# Patient Record
Sex: Female | Born: 2000 | Race: White | Hispanic: No | Marital: Single | Smoking: Former smoker
Health system: Southern US, Community
[De-identification: ages and names within clinical notes are randomized; demographics above are authoritative.]

## PROBLEM LIST (undated history)

## (undated) ENCOUNTER — Inpatient Hospital Stay: Payer: Self-pay

## (undated) DIAGNOSIS — N83209 Unspecified ovarian cyst, unspecified side: Secondary | ICD-10-CM

## (undated) DIAGNOSIS — B2790 Infectious mononucleosis, unspecified without complication: Secondary | ICD-10-CM

## (undated) DIAGNOSIS — T7840XA Allergy, unspecified, initial encounter: Secondary | ICD-10-CM

## (undated) DIAGNOSIS — J302 Other seasonal allergic rhinitis: Secondary | ICD-10-CM

## (undated) DIAGNOSIS — R51 Headache: Secondary | ICD-10-CM

## (undated) DIAGNOSIS — S82892A Other fracture of left lower leg, initial encounter for closed fracture: Secondary | ICD-10-CM

## (undated) HISTORY — PX: WISDOM TOOTH EXTRACTION: SHX21

## (undated) HISTORY — PX: APPENDECTOMY: SHX54

## (undated) HISTORY — DX: Unspecified ovarian cyst, unspecified side: N83.209

## (undated) HISTORY — PX: TONSILLECTOMY: SUR1361

---
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
## 2018-09-17 ENCOUNTER — Ambulatory Visit: Payer: BLUE CROSS/BLUE SHIELD | Admitting: Obstetrics & Gynecology

## 2018-09-20 ENCOUNTER — Encounter: Payer: Self-pay | Admitting: Obstetrics and Gynecology

## 2018-09-20 ENCOUNTER — Ambulatory Visit: Payer: BLUE CROSS/BLUE SHIELD | Admitting: Obstetrics and Gynecology

## 2018-09-20 ENCOUNTER — Other Ambulatory Visit: Payer: Self-pay

## 2018-09-20 VITALS — BP 100/64 | Wt 130.0 lb

## 2018-09-20 DIAGNOSIS — Z3046 Encounter for surveillance of implantable subdermal contraceptive: Secondary | ICD-10-CM

## 2018-09-20 DIAGNOSIS — Z3049 Encounter for surveillance of other contraceptives: Secondary | ICD-10-CM | POA: Diagnosis not present

## 2018-09-22 ENCOUNTER — Encounter: Payer: Self-pay | Admitting: Obstetrics and Gynecology

## 2018-09-22 NOTE — Progress Notes (Signed)
  GYNECOLOGY PROCEDURE NOTE  Nexplanon removal discussed in detail.  Risks of infection, bleeding, nerve injury all reviewed.  Patient understands risks and desires to proceed.  Verbal consent obtained.  Patient is certain she wants the Nexplanon removed.  All questions answered.  Procedure: Patient placed in dorsal supine with left arm above head, elbow flexed at 90 degrees, arm resting on examination table.  Nexplanon identified without problems.  Betadine scrub x3.  1 ml of 1% lidocaine injected under Nexplanondevice without problems.  Sterile gloves applied.  Small 0.5cm incision made at distal tip of Nexplanon device with 11 blade scalpel.  Nexplanon brought to incision and grasped with a small kelly clamp.  Nexplanon removed intact without problems.  Pressure applied to incision.  Hemostasis obtained.  Steri-strips applied, followed by bandage and compression dressing.  Patient tolerated procedure well.  No complications.   Assessment: 18 y.o. year old female now s/p uncomplicated Nexplanon removal.  Plan: 1.  Patient given post procedure precautions and asked to call for fever, chills, redness or drainage from her incision, bleeding from incision.  She understands she will likely have a small bruise near site of removal and can remove bandage tomorrow and steri-strips in approximately 1 week.  2) Contraception: discussed multiple forms.  She will consider an IUD or an alternative form of contraception. She states she had an IUD in the past and multiple forms of combined oral contraceptives were utilized to try to control her irregular bleeding, which were ineffective.  She also states that multiple tests were run to determine why she continued to bleed.  None of these test resulted in any etiology.  She will see if removal of the Nexplanon device will regulate her periods and return with a new plan.  She was given strict precautions about using some form of at least barrier method of birth  control in the meantime.  Prentice Docker, MD, [facility], [facility] 09/20/2018 3:36 PM

## 2018-09-27 ENCOUNTER — Ambulatory Visit: Payer: BLUE CROSS/BLUE SHIELD | Admitting: Obstetrics and Gynecology

## 2019-02-18 DIAGNOSIS — M5416 Radiculopathy, lumbar region: Secondary | ICD-10-CM | POA: Insufficient documentation

## 2019-02-26 ENCOUNTER — Ambulatory Visit: Payer: BLUE CROSS/BLUE SHIELD | Admitting: Obstetrics and Gynecology

## 2019-03-21 DIAGNOSIS — M545 Low back pain, unspecified: Secondary | ICD-10-CM | POA: Insufficient documentation

## 2019-06-09 ENCOUNTER — Other Ambulatory Visit: Payer: Self-pay

## 2019-06-09 ENCOUNTER — Ambulatory Visit
Admission: EM | Admit: 2019-06-09 | Discharge: 2019-06-09 | Disposition: A | Discharge status: Home / Self Care | Payer: BC Managed Care – PPO | Attending: Emergency Medicine | Admitting: Emergency Medicine

## 2019-06-09 DIAGNOSIS — J069 Acute upper respiratory infection, unspecified: Secondary | ICD-10-CM

## 2019-06-09 DIAGNOSIS — Z1152 Encounter for screening for COVID-19: Secondary | ICD-10-CM

## 2019-06-09 DIAGNOSIS — R059 Cough, unspecified: Secondary | ICD-10-CM

## 2019-06-09 DIAGNOSIS — R05 Cough: Secondary | ICD-10-CM

## 2019-06-09 MED ORDER — PREDNISONE 10 MG PO TABS: 20.0000 mg | ORAL_TABLET | Freq: Every day | ORAL | 0 refills | Status: DC

## 2019-06-09 MED ORDER — BENZONATATE 100 MG PO CAPS: 100.0000 mg | ORAL_CAPSULE | Freq: Three times a day (TID) | ORAL | 0 refills | Status: DC

## 2019-06-09 MED ORDER — LORATADINE 10 MG PO TABS: 10.0000 mg | ORAL_TABLET | Freq: Every day | ORAL | 0 refills | Status: DC

## 2019-06-09 MED ORDER — FLUTICASONE PROPIONATE 50 MCG/ACT NA SUSP: 1.0000 | Freq: Every day | NASAL | 0 refills | Status: DC

## 2019-06-09 NOTE — ED Provider Notes (Signed)
[facility]    CSN: [id number] Arrival date & time: 06/09/19  1626      History   Chief Complaint Chief Complaint  Patient presents with  . Cough    HPI Destiny Mckenzie is a 19 y.o. female.   who presents  the urgent care with a complaint of cough, sore throat, runny nose, bilateral ear pain with left worse than the right for the past 3 days.  Denies sick exposure to COVID, flu or strep.  Denies recent travel.  Denies aggravating or alleviating symptoms.  Denies previous COVID infection.   Denies fever, chills, fatigue, rhinorrhea, SOB, wheezing, chest pain, nausea, vomiting, changes in bowel or bladder habits.    The history is provided by the patient. No language interpreter was used.  Cough Associated symptoms: ear pain and sore throat     Past Medical History:  Diagnosis Date  . Allergy   . Fracture of ankle, left, closed 2012 and 2013  . Headache(784.0)   . Mononucleosis jan 2014  . Seasonal allergies     Patient Active Problem List   Diagnosis Date Noted  . Acute appendicitis 10/07/2013    Past Surgical History:  Procedure Laterality Date  . APPENDECTOMY    . LAPAROSCOPIC APPENDECTOMY N/A 10/07/2013   Procedure: APPENDECTOMY LAPAROSCOPIC;  Surgeon: Dalia Heading, MD;  Location: [facility];  Service: General;  Laterality: N/A;  . TONSILLECTOMY    . TONSILLECTOMY  01/30/12    OB History    Gravida  0   Para  0   Term  0   Preterm  0   AB  0   Living  0     SAB  0   TAB  0   Ectopic  0   Multiple  0   Live Births  0            Home Medications    Prior to Admission medications   Medication Sig Start Date End Date Taking? Authorizing Provider  benzonatate (TESSALON) 100 MG capsule Take 1 capsule (100 mg total) by mouth every 8 (eight) hours. 06/09/19   Suhayla Chisom, Zachery Dakins, FNP  fluticasone (FLONASE) 50 MCG/ACT nasal spray Place 1 spray into both nostrils daily for 14 days. 06/09/19 06/23/19  Savannaha Stonerock, Zachery Dakins, FNP  ibuprofen  (ADVIL,MOTRIN) 600 MG tablet Take 1 tablet (600 mg total) by mouth every 6 (six) hours as needed. 03/05/17   Eber Hong, MD  predniSONE (DELTASONE) 10 MG tablet Take 2 tablets (20 mg total) by mouth daily. 06/09/19   Tineka Uriegas, Zachery Dakins, FNP    Family History Family History  Problem Relation Age of Onset  . Obesity Mother     Social History Social History   Tobacco Use  . Smoking status: Former Smoker    Packs/day: 0.30  . Smokeless tobacco: Never Used  Substance Use Topics  . Alcohol use: No  . Drug use: No     Allergies   Contrast media [iodinated diagnostic agents], Fleet enema [enema], Hydroxyzine, Miralax [polyethylene glycol], Bactrim [sulfamethoxazole-trimethoprim], Contrast media [iodinated diagnostic agents], Hydroxyzine, and Sulfa antibiotics   Review of Systems Review of Systems  Constitutional: Negative.   HENT: Positive for congestion, ear pain and sore throat.   Respiratory: Positive for cough.   Cardiovascular: Negative.   Gastrointestinal: Negative.   Neurological: Negative.   All other systems reviewed and are negative.    Physical Exam Triage Vital Signs ED Triage Vitals [06/09/19 1719]  Enc Vitals Group  BP 109/73     Pulse Rate 83     Resp 16     Temp 98.1 F (36.7 C)     Temp src      SpO2 98 %     Weight      Height      Head Circumference      Peak Flow      Pain Score 2     Pain Loc      Pain Edu?      Excl. in GC?    No data found.  Updated Vital Signs BP 109/73   Pulse 83   Temp 98.1 F (36.7 C)   Resp 16   LMP 06/01/2019   SpO2 98%   Visual Acuity Right Eye Distance:   Left Eye Distance:   Bilateral Distance:    Right Eye Near:   Left Eye Near:    Bilateral Near:     Physical Exam Vitals and nursing note reviewed.  Constitutional:      General: She is not in acute distress.    Appearance: Normal appearance. She is normal weight. She is not ill-appearing or toxic-appearing.  HENT:     Head:  Normocephalic.     Right Ear: Tympanic membrane, ear canal and external ear normal. There is no impacted cerumen.     Left Ear: Tympanic membrane, ear canal and external ear normal. There is no impacted cerumen.     Nose: Nose normal. No congestion.     Mouth/Throat:     Mouth: Mucous membranes are moist.     Pharynx: Oropharynx is clear. No oropharyngeal exudate or posterior oropharyngeal erythema.  Cardiovascular:     Rate and Rhythm: Normal rate and regular rhythm.     Pulses: Normal pulses.     Heart sounds: Normal heart sounds. No murmur.  Pulmonary:     Effort: Pulmonary effort is normal. No respiratory distress.     Breath sounds: Normal breath sounds. No wheezing or rhonchi.  Chest:     Chest wall: No tenderness.  Neurological:     Mental Status: She is alert and oriented to person, place, and time.      UC Treatments / Results  Labs (all labs ordered are listed, but only abnormal results are displayed) Labs Reviewed  NOVEL CORONAVIRUS, NAA    EKG   Radiology No results found.  Procedures Procedures (including critical care time)  Medications Ordered in UC Medications - No data to display  Initial Impression / Assessment and Plan / UC Course  I have reviewed the triage vital signs and the nursing notes.  Pertinent labs & imaging results that were available during my care of the patient were reviewed by me and considered in my medical decision making (see chart for details).    Patient is stable at discharge.  Tessalon Perles,  Flonase and prednisone were prescribed.  Was advised to quarantine until COVID-19 test result become available.  Work note was given.  Final Clinical Impressions(s) / UC Diagnoses   Final diagnoses:  Cough  Encounter for screening for COVID-19  URI with cough and congestion     Discharge Instructions     COVID testing ordered.  It will take between 2-7 days for test results.  Someone will contact you regarding abnormal  results.    In the meantime: You should remain isolated in your home for 10 days from symptom onset AND greater than 24 hours after symptoms resolution (absence of fever  without the use of fever-reducing medication and improvement in respiratory symptoms), whichever is longer Get plenty of rest and push fluids Tessalon Perles prescribed for cough Flonase prescribed for nasal congestion and runny nose Prednisone was prescribed Use medications daily for symptom relief Use OTC medications like ibuprofen or tylenol as needed fever or pain Call or go to the ED if you have any new or worsening symptoms such as fever, worsening cough, shortness of breath, chest tightness, chest pain, turning blue, changes in mental status, etc...     ED Prescriptions    Medication Sig Dispense Auth. Provider   benzonatate (TESSALON) 100 MG capsule Take 1 capsule (100 mg total) by mouth every 8 (eight) hours. 30 capsule Lowery Paullin S, FNP   fluticasone (FLONASE) 50 MCG/ACT nasal spray Place 1 spray into both nostrils daily for 14 days. 16 g Jandy Brackens S, FNP   predniSONE (DELTASONE) 10 MG tablet Take 2 tablets (20 mg total) by mouth daily. 15 tablet Dalin Caldera, Darrelyn Hillock, FNP     PDMP not reviewed this encounter.   Emerson Monte, [place] 06/09/19 1752

## 2019-06-09 NOTE — ED Triage Notes (Addendum)
Cough, sore throat, runny nose and bilateral ear pain (worse on left), denies fever since Friday.  patient had COVID in December and states does not feel the same, patient declined testing

## 2019-06-09 NOTE — Discharge Instructions (Addendum)
COVID testing ordered.  It will take between 2-7 days for test results.  Someone will contact you regarding abnormal results.    In the meantime: You should remain isolated in your home for 10 days from symptom onset AND greater than 24  hours after symptoms resolution (absence of fever without the use of fever-reducing medication and improvement in respiratory symptoms), whichever is longer Get plenty of rest and push fluids Tessalon Perles prescribed for cough Flonase prescribed for nasal congestion and runny nose Prednisone was prescribed Use medications daily for symptom relief Use OTC medications like ibuprofen or tylenol as needed fever or pain Call or go to the ED if you have any new or worsening symptoms such as fever, worsening cough, shortness of breath, chest tightness, chest pain, turning blue, changes in mental status, etc.... 

## 2019-06-10 LAB — NOVEL CORONAVIRUS, NAA: SARS-CoV-2, NAA: NOT DETECTED

## 2019-06-10 LAB — SARS-COV-2, NAA 2 DAY TAT

## 2019-09-03 ENCOUNTER — Encounter: Payer: Self-pay | Admitting: Advanced Practice Midwife

## 2019-09-03 ENCOUNTER — Other Ambulatory Visit: Payer: Self-pay

## 2019-09-03 ENCOUNTER — Ambulatory Visit (INDEPENDENT_AMBULATORY_CARE_PROVIDER_SITE_OTHER): Payer: BC Managed Care – PPO | Admitting: Advanced Practice Midwife

## 2019-09-03 VITALS — BP 110/70 | Ht 63.0 in | Wt 130.0 lb

## 2019-09-03 DIAGNOSIS — R102 Pelvic and perineal pain: Secondary | ICD-10-CM

## 2019-09-03 DIAGNOSIS — Z01419 Encounter for gynecological examination (general) (routine) without abnormal findings: Secondary | ICD-10-CM

## 2019-09-03 DIAGNOSIS — N921 Excessive and frequent menstruation with irregular cycle: Secondary | ICD-10-CM | POA: Diagnosis not present

## 2019-09-03 DIAGNOSIS — Z30011 Encounter for initial prescription of contraceptive pills: Secondary | ICD-10-CM | POA: Diagnosis not present

## 2019-09-03 MED ORDER — NORETHIN ACE-ETH ESTRAD-FE 1-20 MG-MCG PO TABS: 1.0000 | ORAL_TABLET | Freq: Every day | ORAL | 4 refills | Status: DC

## 2019-09-04 ENCOUNTER — Encounter: Payer: Self-pay | Admitting: Advanced Practice Midwife

## 2019-09-04 NOTE — Progress Notes (Signed)
Gynecology Annual Exam  PCP: Moishe Spice Evern Bio, MD  Chief Complaint:  Chief Complaint  Patient presents with  . Gynecologic Exam    aunnal exam    History of Present Illness: Patient is a 19 y.o. G0P0000 presents for annual exam. The patient has complaints today of frequent, heavy and painful periods. She had her Nexplanon removed about a year ago and in recent months has had heavy flow periods every 2 weeks lasting for 7 days. She has abdominal and back pain with her periods. She requests hormonal birth control for contraception/cycle control.  LMP: Patient's last menstrual period was 08/27/2019. Menarche:9 Average Interval: regular, 14 days Duration of flow: 7 days Heavy Menses: yes Clots: yes Intermenstrual Bleeding: no Postcoital Bleeding: no Dysmenorrhea: yes  The patient is not currently sexually active. She currently uses none for contraception. She denies dyspareunia.  The patient does perform self breast exams.  There is no notable family history of breast or ovarian cancer in her family.  The patient wears seatbelts: yes.  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 She admits healthy diet and drinks some water. She drinks 2 sodas per day. She gets about 5 hours sleep per night.    The patient denies current symptoms of depression.    Review of Systems: Review of Systems  Constitutional: Negative for chills and fever.  HENT: Negative for congestion, ear discharge, ear pain, hearing loss, sinus pain and sore throat.   Eyes: Negative for blurred vision and double vision.  Respiratory: Negative for cough, shortness of breath and wheezing.   Cardiovascular: Negative for chest pain, palpitations and leg swelling.  Gastrointestinal: Negative for abdominal pain, blood in stool, constipation, diarrhea, heartburn, melena, nausea and vomiting.  Genitourinary: Negative for dysuria, flank pain, frequency, hematuria and urgency.       Positive for heavy,  painful, frequent periods  Musculoskeletal: Negative for back pain, joint pain and myalgias.  Skin: Negative for itching and rash.  Neurological: Negative for dizziness, tingling, tremors, sensory change, speech change, focal weakness, seizures, loss of consciousness, weakness and headaches.  Endo/Heme/Allergies: Negative for environmental allergies. Does not bruise/bleed easily.  Psychiatric/Behavioral: Negative for depression, hallucinations, memory loss, substance abuse and suicidal ideas. The patient is not nervous/anxious and does not have insomnia.     Past Medical History:  Patient Active Problem List   Diagnosis Date Noted  . Low back pain 03/21/2019  . Lumbar radiculopathy 02/18/2019    Past Surgical History:  Past Surgical History:  Procedure Laterality Date  . APPENDECTOMY    . LAPAROSCOPIC APPENDECTOMY N/A 10/07/2013   Procedure: APPENDECTOMY LAPAROSCOPIC;  Surgeon: Dalia Heading, MD;  Location: [facility];  Service: General;  Laterality: N/A;  . TONSILLECTOMY    . TONSILLECTOMY  01/30/12    Gynecologic History:  Patient's last menstrual period was 08/27/2019. Contraception: none Last Pap: has not had a PAP smear- will defer til next year  Obstetric History: G0P0000  Family History:  Family History  Problem Relation Age of Onset  . Obesity Mother     Social History:  Social History   Socioeconomic History  . Marital status: Single    Spouse name: Not on file  . Number of children: Not on file  . Years of education: Not on file  . Highest education level: Not on file  Occupational History  . Not on file  Tobacco Use  . Smoking status: Former Smoker    Packs/day: 0.30  .  Smokeless tobacco: Never Used  Substance and Sexual Activity  . Alcohol use: No  . Drug use: No  . Sexual activity: Yes    Partners: Male    Birth control/protection: Implant  Other Topics Concern  . Not on file  Social History Narrative   ** Merged History Encounter **        Social Determinants of Health   Financial Resource Strain:   . Difficulty of Paying Living Expenses:   Food Insecurity:   . Worried About Programme researcher, broadcasting/film/video in the Last Year:   . Barista in the Last Year:   Transportation Needs:   . Freight forwarder (Medical):   [name] Lack of Transportation (Non-Medical):   Physical Activity:   . Days of Exercise per Week:   . Minutes of Exercise per Session:   Stress:   . Feeling of Stress :   Social Connections:   . Frequency of Communication with Friends and Family:   . Frequency of Social Gatherings with Friends and Family:   . Attends Religious Services:   . Active Member of Clubs or Organizations:   . Attends Banker Meetings:   [name] Marital Status:   Intimate Partner Violence:   . Fear of Current or Ex-Partner:   . Emotionally Abused:   [name] Physically Abused:   . Sexually Abused:     Allergies:  Allergies  Allergen Reactions  . Tramadol Other (See Comments)  . Fleet Enema [Enema]   . Miralax [Polyethylene Glycol]   . Contrast Media [Iodinated Diagnostic Agents] Rash  . Hydroxyzine Rash  . Sodium Phosphates Rash  . Sulfa Antibiotics Rash    Medications: Prior to Admission medications   Medication Sig Start Date End Date Taking? Authorizing Provider  acetaminophen (TYLENOL) 500 MG tablet Take by mouth.    [provider]  albuterol (VENTOLIN HFA) 108 (90 Base) MCG/ACT inhaler Inhale into the lungs.    [provider]  Cetirizine HCl (ZYRTEC ALLERGY) 10 MG CAPS Take by mouth.    [provider]  fexofenadine (ALLEGRA) 60 MG tablet Take by mouth.    [provider]  fluticasone (FLONASE) 50 MCG/ACT nasal spray Place into the nose.    [provider]  Loratadine (CLARITIN) 10 MG CAPS Take by mouth.    [provider]  norethindrone-ethinyl estradiol (JUNEL FE 1/20) 1-20 MG-MCG tablet Take 1 tablet by mouth daily. 09/03/19   Tresea Mall, CNM  olopatadine  (PATANOL) 0.1 % ophthalmic solution Apply to eye.    [provider]  polyethylene glycol powder (GLYCOLAX/MIRALAX) 17 GM/SCOOP powder Take by mouth.    [provider]    Physical Exam Vitals: Blood pressure 110/70, height 5\' 3"  (1.6 m), weight 130 lb (59 kg), last menstrual period 08/27/2019.  General: NAD HEENT: normocephalic, anicteric Thyroid: no enlargement, no palpable nodules Pulmonary: No increased work of breathing, CTAB Cardiovascular: RRR, distal pulses 2+ Breast: Breast symmetrical, no tenderness, no palpable nodules or masses, no skin or nipple retraction present, no nipple discharge.  No axillary or supraclavicular lymphadenopathy. Abdomen: NABS, soft, non-tender, non-distended.  Umbilicus without lesions.  No hepatomegaly, splenomegaly or masses palpable. No evidence of hernia  Genitourinary:  External: Normal external female genitalia.  Normal urethral meatus, normal Bartholin's and Skene's glands.    Vagina: Normal vaginal mucosa, no evidence of prolapse.    Cervix: no CMT  Uterus: Non-enlarged, mobile, normal contour.    Adnexa: ovaries non-enlarged, no adnexal masses, moderate  tenderness to palpation on left side  Rectal: deferred  Lymphatic: no evidence of inguinal lymphadenopathy Extremities: no edema, erythema, or tenderness Neurologic: Grossly intact Psychiatric: mood appropriate, affect full    Assessment: 19 y.o. G0P0000 routine annual exam, menometrorrhagia, left ovary tenderness  Plan: Problem List Items Addressed This Visit    None    Visit Diagnoses    Pelvic pain in female    -  Primary   Relevant Orders   US PELVIS TRANSVAGINAL NON-OB (TV ONLY)   Menometrorrhagia       Relevant Medications   norethindrone-ethinyl estradiol (JUNEL FE 1/20) 1-20 MG-MCG tablet   Encounter for initial prescription of contraceptive pills       Relevant Medications   norethindrone-ethinyl estradiol (JUNEL FE 1/20) 1-20 MG-MCG tablet   Well woman  exam with routine gynecological exam       Relevant Medications   norethindrone-ethinyl estradiol (JUNEL FE 1/20) 1-20 MG-MCG tablet      1) 4) Gardasil Series discussed and if applicable offered to patient - Patient has not previously completed 3 shot series   2) STI screening  was offered and declined  3)  ASCCP guidelines and rationale discussed.  Patient opts for beginning at age 89 screening interval  4) Contraception - the patient is currently using  none.  She is interested in starting Contraception: OCP Rx sent today. We discussed safe sex practices to reduce her furture risk of STI's.    5) Return in about 1 week (around 09/10/2019) for gyn u/s and follow up visit.   Tresea Mall, CNM [facility] 09/04/2019, 12:07 PM

## 2019-09-14 ENCOUNTER — Ambulatory Visit
Admission: EM | Admit: 2019-09-14 | Discharge: 2019-09-14 | Disposition: A | Discharge status: Home / Self Care | Payer: BC Managed Care – PPO | Attending: Family Medicine | Admitting: Family Medicine

## 2019-09-14 ENCOUNTER — Ambulatory Visit (INDEPENDENT_AMBULATORY_CARE_PROVIDER_SITE_OTHER): Payer: BC Managed Care – PPO

## 2019-09-14 DIAGNOSIS — S99921A Unspecified injury of right foot, initial encounter: Secondary | ICD-10-CM | POA: Diagnosis not present

## 2019-09-14 DIAGNOSIS — M79671 Pain in right foot: Secondary | ICD-10-CM

## 2019-09-14 DIAGNOSIS — S93401A Sprain of unspecified ligament of right ankle, initial encounter: Secondary | ICD-10-CM | POA: Diagnosis not present

## 2019-09-14 DIAGNOSIS — M25571 Pain in right ankle and joints of right foot: Secondary | ICD-10-CM

## 2019-09-14 NOTE — Discharge Instructions (Addendum)
Take ibuprofen as needed.  Rest and elevate your foot.  Apply ice packs 2-3 times a day for up to 20 minutes each. Wear the brace as needed and for comfort.  Follow up with your primary care provider or an orthopedist if you symptoms continue or worsen;  Or if you develop new symptoms, such as numbness, tingling, or weakness.

## 2019-09-14 NOTE — ED Triage Notes (Signed)
Pt has injury to right foot after someone fell off stool on to her foot

## 2019-09-14 NOTE — ED Provider Notes (Signed)
Destiny Mckenzie   [id number] 09/14/19 Arrival Time: 1541  YQ:[id number] PAIN  SUBJECTIVE: History from: patient. Destiny Mckenzie is a 18 y.o. female complains of right foot and ankle pain that began about an hour ago. Reports that someone fell off of a stool and onto her foot. Localizes the pain to the medial aspect of the right foot and ankle. Describes the pain as constant and achy in character. Has not taken medications. Symptoms are made worse with activity. Denies similar symptoms in the past.  Denies fever, chills, erythema, ecchymosis, effusion, weakness, numbness and tingling, saddle paresthesias, loss of bowel or bladder function.      ROS: As per HPI.  All other pertinent ROS negative.     Past Medical History:  Diagnosis Date  . Allergy   . Fracture of ankle, left, closed 2012 and 2013  . Headache(784.0)   . Mononucleosis jan 2014  . Seasonal allergies    Past Surgical History:  Procedure Laterality Date  . APPENDECTOMY    . LAPAROSCOPIC APPENDECTOMY N/A 10/07/2013   Procedure: APPENDECTOMY LAPAROSCOPIC;  Surgeon: Dalia Heading, MD;  Location: [facility];  Service: General;  Laterality: N/A;  . TONSILLECTOMY    . TONSILLECTOMY  01/30/12   Allergies  Allergen Reactions  . Tramadol Other (See Comments)  . Fleet Enema [Enema]   . Miralax [Polyethylene Glycol]   . Contrast Media [Iodinated Diagnostic Agents] Rash  . Hydroxyzine Rash  . Sodium Phosphates Rash  . Sulfa Antibiotics Rash   No current facility-administered medications on file prior to encounter.   Current Outpatient Medications on File Prior to Encounter  Medication Sig Dispense Refill  . acetaminophen (TYLENOL) 500 MG tablet Take by mouth.    [name] albuterol (VENTOLIN HFA) 108 (90 Base) MCG/ACT inhaler Inhale into the lungs.    . Cetirizine HCl (ZYRTEC ALLERGY) 10 MG CAPS Take by mouth.    . fexofenadine (ALLEGRA) 60 MG tablet Take by mouth.    . fluticasone (FLONASE) 50 MCG/ACT nasal spray Place into the  nose.    . Loratadine (CLARITIN) 10 MG CAPS Take by mouth.    . norethindrone-ethinyl estradiol (JUNEL FE 1/20) 1-20 MG-MCG tablet Take 1 tablet by mouth daily. 84 tablet 4  . olopatadine (PATANOL) 0.1 % ophthalmic solution Apply to eye.    . polyethylene glycol powder (GLYCOLAX/MIRALAX) 17 GM/SCOOP powder Take by mouth.     Social History   Socioeconomic History  . Marital status: Single    Spouse name: Not on file  . Number of children: Not on file  . Years of education: Not on file  . Highest education level: Not on file  Occupational History  . Not on file  Tobacco Use  . Smoking status: Former Smoker    Packs/day: 0.30  . Smokeless tobacco: Never Used  Substance and Sexual Activity  . Alcohol use: No  . Drug use: No  . Sexual activity: Yes    Partners: Male    Birth control/protection: Implant  Other Topics Concern  . Not on file  Social History Narrative   ** Merged History Encounter **       Social Determinants of Health   Financial Resource Strain:   . Difficulty of Paying Living Expenses: Not on file  Food Insecurity:   . Worried About Programme researcher, broadcasting/film/video in the Last Year: Not on file  . Ran Out of Food in the Last Year: Not on file  Transportation Needs:   . Lack  of Transportation (Medical): Not on file  . Lack of Transportation (Non-Medical): Not on file  Physical Activity:   . Days of Exercise per Week: Not on file  . Minutes of Exercise per Session: Not on file  Stress:   . Feeling of Stress : Not on file  Social Connections:   . Frequency of Communication with Friends and Family: Not on file  . Frequency of Social Gatherings with Friends and Family: Not on file  . Attends Religious Services: Not on file  . Active Member of Clubs or Organizations: Not on file  . Attends Banker Meetings: Not on file  . Marital Status: Not on file  Intimate Partner Violence:   . Fear of Current or Ex-Partner: Not on file  . Emotionally Abused: Not on  file  . Physically Abused: Not on file  . Sexually Abused: Not on file   Family History  Problem Relation Age of Onset  . Obesity Mother     OBJECTIVE:  Vitals:   09/14/19 1609  BP: 105/64  Pulse: 95  Resp: 20  Temp: 98.2 F (36.8 C)  SpO2: 99%    General appearance: ALERT; in no acute distress.  Head: NCAT Lungs: Normal respiratory effort CV: pulses 2+ bilaterally. Cap refill < 2 seconds Musculoskeletal:  Inspection: Skin warm, dry, clear and intact without obvious erythema, effusion, or ecchymosis.  Palpation: left ankle mildly tender to palpation ROM: limited ROM active and passive to R foot and ankle Skin: warm and dry Neurologic: Ambulates without difficulty; Sensation intact about the upper/ lower extremities Psychological: alert and cooperative; normal mood and affect  DIAGNOSTIC STUDIES:  No results found.   ASSESSMENT & PLAN:  1. Sprain of right ankle, unspecified ligament, initial encounter   2. Right foot pain   3. Acute right ankle pain    Right foot xray negative Brace applied in office Wear brace when up and active and for comfort Continue conservative management of rest, ice, and gentle stretches Take ibuprofen as needed for pain relief (may cause abdominal discomfort, ulcers, and GI bleeds avoid taking with other NSAIDs)  Follow up with PCP if symptoms persist Return or go to the ER if you have any new or worsening symptoms (fever, chills, chest pain, abdominal pain, changes in bowel or bladder habits, pain radiating into lower legs)    Reviewed expectations re: course of current medical issues. Questions answered. Outlined signs and symptoms indicating need for more acute intervention. Patient verbalized understanding. After Visit Summary given.       Moshe Cipro, NP 09/17/19 1354

## 2019-09-16 ENCOUNTER — Ambulatory Visit (INDEPENDENT_AMBULATORY_CARE_PROVIDER_SITE_OTHER): Payer: BC Managed Care – PPO | Admitting: Advanced Practice Midwife

## 2019-09-16 ENCOUNTER — Encounter: Payer: Self-pay | Admitting: Advanced Practice Midwife

## 2019-09-16 ENCOUNTER — Other Ambulatory Visit: Payer: Self-pay

## 2019-09-16 ENCOUNTER — Ambulatory Visit (INDEPENDENT_AMBULATORY_CARE_PROVIDER_SITE_OTHER): Payer: BC Managed Care – PPO

## 2019-09-16 VITALS — BP 120/80 | Ht 63.0 in | Wt 130.0 lb

## 2019-09-16 DIAGNOSIS — R102 Pelvic and perineal pain: Secondary | ICD-10-CM | POA: Diagnosis not present

## 2019-09-16 DIAGNOSIS — Z09 Encounter for follow-up examination after completed treatment for conditions other than malignant neoplasm: Secondary | ICD-10-CM | POA: Diagnosis not present

## 2019-09-16 NOTE — Progress Notes (Signed)
Patient ID: Destiny Mckenzie, female   DOB: November 03, 2000, 19 y.o.   MRN: [id number]  Reason for Consult: Follow-up    Subjective:  HPI:  Destiny Mckenzie is a 19 y.o. female being seen for follow up visit after pelvic ultrasound. We reviewed the normal results of her ultrasound today. She has not yet noticed a difference in her bleeding pattern since she just started her birth control.   Past Medical History:  Diagnosis Date  . Allergy   . Fracture of ankle, left, closed 2012 and 2013  . Headache(784.0)   . Mononucleosis jan 2014  . Seasonal allergies    Family History  Problem Relation Age of Onset  . Obesity Mother    Past Surgical History:  Procedure Laterality Date  . APPENDECTOMY    . LAPAROSCOPIC APPENDECTOMY N/A 10/07/2013   Procedure: APPENDECTOMY LAPAROSCOPIC;  Surgeon: Dalia Heading, MD;  Location: [facility];  Service: General;  Laterality: N/A;  . TONSILLECTOMY    . TONSILLECTOMY  01/30/12    Short Social History:  Social History   Tobacco Use  . Smoking status: Former Smoker    Packs/day: 0.30  . Smokeless tobacco: Never Used  Substance Use Topics  . Alcohol use: No    Allergies  Allergen Reactions  . Tramadol Other (See Comments)  . Fleet Enema [Enema]   . Miralax [Polyethylene Glycol]   . Contrast Media [Iodinated Diagnostic Agents] Rash  . Hydroxyzine Rash  . Sodium Phosphates Rash  . Sulfa Antibiotics Rash    Current Outpatient Medications  Medication Sig Dispense Refill  . acetaminophen (TYLENOL) 500 MG tablet Take by mouth.    [name] albuterol (VENTOLIN HFA) 108 (90 Base) MCG/ACT inhaler Inhale into the lungs.    . Cetirizine HCl (ZYRTEC ALLERGY) 10 MG CAPS Take by mouth.    . fexofenadine (ALLEGRA) 60 MG tablet Take by mouth.    . fluticasone (FLONASE) 50 MCG/ACT nasal spray Place into the nose.    . Loratadine (CLARITIN) 10 MG CAPS Take by mouth.    . norethindrone-ethinyl estradiol (JUNEL FE 1/20) 1-20 MG-MCG tablet Take 1 tablet by mouth  daily. 84 tablet 4  . olopatadine (PATANOL) 0.1 % ophthalmic solution Apply to eye.    . polyethylene glycol powder (GLYCOLAX/MIRALAX) 17 GM/SCOOP powder Take by mouth.    . SODIUM FLUORIDE 5000 PPM 1.1 % PSTE Take by mouth.     No current facility-administered medications for this visit.   Review of Systems  Constitutional: Negative for chills and fever.  HENT: Negative for congestion, ear discharge, ear pain, hearing loss, sinus pain and sore throat.   Eyes: Negative for blurred vision and double vision.  Respiratory: Negative for cough, shortness of breath and wheezing.   Cardiovascular: Negative for chest pain, palpitations and leg swelling.  Gastrointestinal: Negative for abdominal pain, blood in stool, constipation, diarrhea, heartburn, melena, nausea and vomiting.  Genitourinary: Negative for dysuria, flank pain, frequency, hematuria and urgency.  Musculoskeletal: Negative for back pain, joint pain and myalgias.  Skin: Negative for itching and rash.  Neurological: Negative for dizziness, tingling, tremors, sensory change, speech change, focal weakness, seizures, loss of consciousness, weakness and headaches.  Endo/Heme/Allergies: Negative for environmental allergies. Does not bruise/bleed easily.  Psychiatric/Behavioral: Negative for depression, hallucinations, memory loss, substance abuse and suicidal ideas. The patient is not nervous/anxious and does not have insomnia.         Objective:  Objective   Vitals:   09/16/19 1533  BP: 120/80  Weight: 130 lb (59 kg)  Height: 5\' 3"  (1.6 m)   Body mass index is 23.03 kg/m. Constitutional: Well nourished, well developed female in no acute distress.  HEENT: normal Skin: Warm and dry.  Respiratory: Clear to auscultation bilateral. Normal respiratory effort Neuro: DTRs 2+, Cranial nerves grossly intact Psych: Alert and Oriented x3. No memory deficits. Normal mood and affect.  MS: normal gait, normal bilateral lower extremity  ROM/strength/stability.  Data: Narrative & Impression  Patient Name: Destiny Mckenzie DOB: September 14, 2000 MRN: [id number]   ULTRASOUND REPORT  Location: [facility]  Date of Service: 09/16/2019     Indications:Pelvic Pain   Findings:  The uterus is anteverted and measures 6.7 x 4.6 x 2.9 cm. Echo texture is homogenous without evidence of focal masses. The Endometrium measures 6.4 mm.  Right Ovary measures 5.0 x 2.6 x 3.1 cm. It is normal in appearance. There is a dominant follicle in the right ovary measuring 25.0 x 22.2 x 23.7 mm. Left Ovary measures 3.2 x 1.9 x 2.2 cm. It is normal in appearance. Survey of the adnexa demonstrates no adnexal masses. There is no free fluid in the cul de sac.  Impression: 1. Normal pelvic ultrasound.   Recommendations: 1.Clinical correlation with the patient's History and Physical Exam.  09/18/2019, RT    Purpose of visit was primarily consultation. Limited physical exam.  Assessment/Plan:     19 y.o. G0 P0 female with normal pelvic ultrasound  Follow up PRN and for annual exam in 1 year   36 CNM [facility] 09/16/2019, 3:45 PM

## 2020-01-24 NOTE — L&D Delivery Note (Signed)
Delivery Note  First Stage: Labor onset: 0500 Induction: Cytotec, Pitocin, AROM, Cook cath.  Analgesia /Anesthesia intrapartum: IVPM x 1, Epidural AROM at 1433  Second Stage: Complete dilation at 2012 Onset of pushing at 2012 FHR second stage Cat II, variables with UCs, mod variability  Delivery of a viable female infant on 11/20/20 at  2043 by CNM; delivery of fetal head in ROA position with restitution to ROT, Loose nuchal cord x 1;  Anterior then posterior shoulders delivered easily with gentle downward traction. Baby placed on mom's chest, and attended to by peds.  Cord double clamped after cessation of pulsation, cut by pts mother.  Cord blood sample collected   Third Stage: Placenta delivered spontaneously intact with 3VC @ 2053 Placenta disposition: routine disposal Uterine tone firm / bleeding scant  1st deg midline vaginal laceration identified, perineal skin intact.  Anesthesia for repair: epidural Repair 2-0 Vicryl CT-1 Est. Blood Loss (mL): 150  Complications: none  Mom to postpartum.  Baby to Couplet care / Skin to Skin.  Newborn: Birth Weight: pending  Apgar Scores: 8/9 Feeding planned: formula

## 2020-01-27 ENCOUNTER — Ambulatory Visit: Admit: 2020-01-27 | Discharge status: Against Medical Advice | Payer: BC Managed Care – PPO

## 2020-04-05 ENCOUNTER — Other Ambulatory Visit: Payer: Self-pay

## 2020-04-05 ENCOUNTER — Ambulatory Visit (LOCAL_COMMUNITY_HEALTH_CENTER): Payer: Self-pay

## 2020-04-05 VITALS — BP 103/63 | Ht 63.0 in | Wt 141.5 lb

## 2020-04-05 DIAGNOSIS — Z3201 Encounter for pregnancy test, result positive: Secondary | ICD-10-CM

## 2020-04-05 LAB — PREGNANCY, URINE: Preg Test, Ur: POSITIVE — AB

## 2020-04-05 MED ORDER — PRENATAL 27-0.8 MG PO TABS: 1.0000 | ORAL_TABLET | Freq: Every day | ORAL | 0 refills | Status: AC

## 2020-04-05 NOTE — Progress Notes (Signed)
UPT positive. Plans prenatal care at [facility] and pt states she has appt scheduled 04/14/2020. To DSS for Medicaid/Preg women. Jerel Shepherd, RN

## 2020-04-12 ENCOUNTER — Emergency Department
Admission: EM | Admit: 2020-04-12 | Discharge: 2020-04-12 | Disposition: A | Discharge status: Home / Self Care | Payer: Medicaid Other | Attending: Emergency Medicine | Admitting: Emergency Medicine

## 2020-04-12 ENCOUNTER — Other Ambulatory Visit: Payer: Self-pay

## 2020-04-12 ENCOUNTER — Emergency Department: Payer: Medicaid Other

## 2020-04-12 DIAGNOSIS — R1030 Lower abdominal pain, unspecified: Secondary | ICD-10-CM | POA: Diagnosis not present

## 2020-04-12 DIAGNOSIS — Y92410 Unspecified street and highway as the place of occurrence of the external cause: Secondary | ICD-10-CM | POA: Insufficient documentation

## 2020-04-12 DIAGNOSIS — O26891 Other specified pregnancy related conditions, first trimester: Secondary | ICD-10-CM | POA: Insufficient documentation

## 2020-04-12 DIAGNOSIS — Z87891 Personal history of nicotine dependence: Secondary | ICD-10-CM | POA: Diagnosis not present

## 2020-04-12 DIAGNOSIS — Z3A09 9 weeks gestation of pregnancy: Secondary | ICD-10-CM | POA: Diagnosis not present

## 2020-04-12 LAB — CBC
HCT: 36.8 % (ref 36.0–46.0)
Hemoglobin: 12.6 g/dL (ref 12.0–15.0)
MCH: 31.6 pg (ref 26.0–34.0)
MCHC: 34.2 g/dL (ref 30.0–36.0)
MCV: 92.2 fL (ref 80.0–100.0)
Platelets: 420 10*3/uL — ABNORMAL HIGH (ref 150–400)
RBC: 3.99 MIL/uL (ref 3.87–5.11)
RDW: 11.7 % (ref 11.5–15.5)
WBC: 8 10*3/uL (ref 4.0–10.5)
nRBC: 0 % (ref 0.0–0.2)

## 2020-04-12 LAB — URINALYSIS, COMPLETE (UACMP) WITH MICROSCOPIC
Bilirubin Urine: NEGATIVE
Glucose, UA: NEGATIVE mg/dL
Hgb urine dipstick: NEGATIVE
Ketones, ur: NEGATIVE mg/dL
Leukocytes,Ua: NEGATIVE
Nitrite: NEGATIVE
Protein, ur: NEGATIVE mg/dL
Specific Gravity, Urine: 1.01 (ref 1.005–1.030)
pH: 5 (ref 5.0–8.0)

## 2020-04-12 LAB — COMPREHENSIVE METABOLIC PANEL
ALT: 22 U/L (ref 0–44)
AST: 17 U/L (ref 15–41)
Albumin: 4.3 g/dL (ref 3.5–5.0)
Alkaline Phosphatase: 50 U/L (ref 38–126)
Anion gap: 7 (ref 5–15)
BUN: 11 mg/dL (ref 6–20)
CO2: 22 mmol/L (ref 22–32)
Calcium: 9.5 mg/dL (ref 8.9–10.3)
Chloride: 105 mmol/L (ref 98–111)
Creatinine, Ser: 0.79 mg/dL (ref 0.44–1.00)
GFR, Estimated: >60 mL/min (ref >60)
Glucose, Bld: 93 mg/dL (ref 70–99)
Potassium: 4.1 mmol/L (ref 3.5–5.1)
Sodium: 134 mmol/L — ABNORMAL LOW (ref 135–145)
Total Bilirubin: 0.7 mg/dL (ref 0.3–1.2)
Total Protein: 7.3 g/dL (ref 6.5–8.1)

## 2020-04-12 LAB — POC URINE PREG, ED: Preg Test, Ur: POSITIVE — AB

## 2020-04-12 LAB — HCG, QUANTITATIVE, PREGNANCY: hCG, Beta Chain, Quant, S: 21874 m[IU]/mL — ABNORMAL HIGH (ref <5)
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
## 2020-04-12 NOTE — Discharge Instructions (Signed)
Please follow-up with your OB/GYN on the appointment on Wednesday.  As we discussed, there was a gestational sac and a yolk sac, the support structure for the fetus, but no apparent fetus.  This may be because it is too early to see it, or it may have developed without 1.  If you develop any vaginal bleeding or passage of clots prior to seeing your OB/GYN, please return to the ED.

## 2020-04-21 DIAGNOSIS — Z3490 Encounter for supervision of normal pregnancy, unspecified, unspecified trimester: Secondary | ICD-10-CM | POA: Insufficient documentation

## 2020-04-21 HISTORY — DX: Encounter for supervision of normal pregnancy, unspecified, unspecified trimester: Z34.90

## 2020-05-03 ENCOUNTER — Encounter: Payer: Self-pay | Admitting: Emergency Medicine

## 2020-05-03 ENCOUNTER — Emergency Department
Admission: EM | Admit: 2020-05-03 | Discharge: 2020-05-03 | Disposition: A | Discharge status: Home / Self Care | Payer: Medicaid Other | Attending: Emergency Medicine | Admitting: Emergency Medicine

## 2020-05-03 ENCOUNTER — Other Ambulatory Visit: Payer: Self-pay

## 2020-05-03 DIAGNOSIS — J069 Acute upper respiratory infection, unspecified: Secondary | ICD-10-CM

## 2020-05-03 DIAGNOSIS — O219 Vomiting of pregnancy, unspecified: Secondary | ICD-10-CM | POA: Diagnosis not present

## 2020-05-03 DIAGNOSIS — O99511 Diseases of the respiratory system complicating pregnancy, first trimester: Secondary | ICD-10-CM | POA: Insufficient documentation

## 2020-05-03 DIAGNOSIS — E860 Dehydration: Secondary | ICD-10-CM | POA: Insufficient documentation

## 2020-05-03 DIAGNOSIS — O26891 Other specified pregnancy related conditions, first trimester: Secondary | ICD-10-CM | POA: Diagnosis present

## 2020-05-03 DIAGNOSIS — Z3A08 8 weeks gestation of pregnancy: Secondary | ICD-10-CM | POA: Diagnosis not present

## 2020-05-03 DIAGNOSIS — Z87891 Personal history of nicotine dependence: Secondary | ICD-10-CM | POA: Insufficient documentation

## 2020-05-03 LAB — URINALYSIS, COMPLETE (UACMP) WITH MICROSCOPIC
Bacteria, UA: NONE SEEN
Bilirubin Urine: NEGATIVE
Glucose, UA: NEGATIVE mg/dL
Hgb urine dipstick: NEGATIVE
Ketones, ur: NEGATIVE mg/dL
Leukocytes,Ua: NEGATIVE
Nitrite: NEGATIVE
Protein, ur: NEGATIVE mg/dL
Specific Gravity, Urine: 1.003 — ABNORMAL LOW (ref 1.005–1.030)
pH: 6 (ref 5.0–8.0)

## 2020-05-03 MED ORDER — METOCLOPRAMIDE HCL 10 MG PO TABS: 10.0000 mg | ORAL_TABLET | Freq: Three times a day (TID) | ORAL | 1 refills | Status: DC | PRN

## 2020-05-03 MED ORDER — METOCLOPRAMIDE HCL 10 MG PO TABS
10.0000 mg | ORAL_TABLET | Freq: Once | ORAL | Status: AC
  Administered 2020-05-03: 10 mg via ORAL
  Filled 2020-05-03: qty 1

## 2020-05-03 NOTE — ED Provider Notes (Signed)
[facility] Emergency Department Provider Note   ____________________________________________   Event Date/Time   First MD Initiated Contact with Patient 05/03/20 [phone]     (approximate)  I have reviewed the triage vital signs and the nursing notes.   HISTORY  Chief Complaint URI    HPI SEARA HINESLEY is a 20 y.o. female patient complain of sinus congestion cough for 4 days.  Patient was seen at [facility] 2 days ago had a negative Covid test.  Patient denies fever.  Patient complains of generalized body aches.  Patient states she was told she was dehydrated.  Patient is taking Phenergan for nausea secondary to [redacted] weeks gestation.  Patient states she cannot keep anything down.  Patient states the Phenergan only makes her drowsy.         Past Medical History:  Diagnosis Date  . Allergy   . Fracture of ankle, left, closed 2012 and 2013  . Headache(784.0)   . Mononucleosis jan 2014  . Seasonal allergies     Patient Active Problem List   Diagnosis Date Noted  . Low back pain 03/21/2019  . Lumbar radiculopathy 02/18/2019    Past Surgical History:  Procedure Laterality Date  . APPENDECTOMY    . LAPAROSCOPIC APPENDECTOMY N/A 10/07/2013   Procedure: APPENDECTOMY LAPAROSCOPIC;  Surgeon: Dalia Heading, MD;  Location: [facility];  Service: General;  Laterality: N/A;  . TONSILLECTOMY    . TONSILLECTOMY  01/30/12    Prior to Admission medications   Medication Sig Start Date End Date Taking? Authorizing Provider  metoCLOPramide (REGLAN) 10 MG tablet Take 1 tablet (10 mg total) by mouth every 8 (eight) hours as needed for nausea. 05/03/20 05/03/21 Yes Joni Reining, PA-C  acetaminophen (TYLENOL) 500 MG tablet Take by mouth. Patient not taking: Reported on 04/05/2020    [provider]  albuterol (VENTOLIN HFA) 108 (90 Base) MCG/ACT inhaler Inhale into the lungs. Patient not taking: Reported on 04/05/2020    [provider]   Cetirizine HCl (ZYRTEC ALLERGY) 10 MG CAPS Take by mouth. Patient not taking: Reported on 04/05/2020    [provider]  fexofenadine (ALLEGRA) 60 MG tablet Take by mouth. Patient not taking: Reported on 04/05/2020    [provider]  fluticasone (FLONASE) 50 MCG/ACT nasal spray Place into the nose. Patient not taking: Reported on 04/05/2020    [provider]  Loratadine 10 MG CAPS Take by mouth. Patient not taking: Reported on 04/05/2020    [provider]  Prenatal Vit-Fe Fumarate-FA (MULTIVITAMIN-PRENATAL) 27-0.8 MG TABS tablet Take 1 tablet by mouth daily at 12 noon. 04/05/20 07/14/20  Federico Flake, MD    Allergies Tramadol, Fleet enema [enema], Miralax [polyethylene glycol], Bactrim [sulfamethoxazole-trimethoprim], Contrast media [iodinated diagnostic agents], Hydroxyzine, Sodium phosphates, and Sulfa antibiotics  Family History  Problem Relation Age of Onset  . Obesity Mother     Social History Social History   Tobacco Use  . Smoking status: Former Smoker    Packs/day: 0.30  . Smokeless tobacco: Never Used  Vaping Use  . Vaping Use: Every day  . Substances: Nicotine  Substance Use Topics  . Alcohol use: Not Currently    Comment: last use- 2 weeks ago- PVR coffee  . Drug use: No    Review of Systems  Constitutional: No fever/chills Eyes: No visual changes. ENT: No sore throat. Cardiovascular: Denies chest pain. Respiratory: Denies shortness of breath. Gastrointestinal: No abdominal pain.  No nausea or vomiting.  No diarrhea.  No constipation. Genitourinary: Negative for dysuria. Musculoskeletal: Negative for back pain. Skin: Negative for rash. Neurological: Negative for headaches, focal weakness or numbness. Allergic/Immunilogical: See extensive allergy list.  ____________________________________________   PHYSICAL EXAM:  VITAL SIGNS: ED Triage Vitals  Enc Vitals Group     BP 05/03/20 0924 112/68     Pulse Rate  05/03/20 0924 (!) 101     Resp 05/03/20 0924 18     Temp 05/03/20 0924 98.2 F (36.8 C)     Temp Source 05/03/20 0924 Oral     SpO2 05/03/20 0924 100 %     Weight 05/03/20 0920 141 lb (64 kg)     Height 05/03/20 0920 5\' 3"  (1.6 m)     Head Circumference --      Peak Flow --      Pain Score 05/03/20 0920 6     Pain Loc --      Pain Edu? --      Excl. in GC? --    Constitutional: Alert and oriented. Well appearing and in no acute distress. Eyes: Conjunctivae are normal. PERRL. EOMI. Head: Atraumatic. Nose: No congestion/rhinnorhea. Mouth/Throat: Mucous membranes are moist.  Oropharynx non-erythematous. Neck: No stridor. Hematological/Lymphatic/Immunilogical: No cervical lymphadenopathy. Cardiovascular: Normal rate, regular rhythm. Grossly normal heart sounds.  Good peripheral circulation. Respiratory: Normal respiratory effort.  No retractions. Lungs CTAB. Gastrointestinal: Soft and nontender. No distention. No abdominal bruits. No CVA tenderness. Genitourinary: Deferred Neurologic:  Normal speech and language. No gross focal neurologic deficits are appreciated. No gait instability. Skin:  Skin is warm, dry and intact. No rash noted. Psychiatric: Mood and affect are normal. Speech and behavior are normal.  ____________________________________________   LABS (all labs ordered are listed, but only abnormal results are displayed)  Labs Reviewed  URINALYSIS, COMPLETE (UACMP) WITH MICROSCOPIC - Abnormal; Notable for the following components:      Result Value   Color, Urine STRAW (*)    APPearance HAZY (*)    Specific Gravity, Urine 1.003 (*)    All other components within normal limits   ____________________________________________  EKG   ____________________________________________  RADIOLOGY I, 07/03/20, personally viewed and evaluated these images (plain radiographs) as part of my medical decision making, as well as reviewing the written report by the  radiologist.  ED MD interpretation:    Official radiology report(s): No results found.  ____________________________________________   PROCEDURES  Procedure(s) performed (including Critical Care):  Procedures   ____________________________________________   INITIAL IMPRESSION / ASSESSMENT AND PLAN / ED COURSE  As part of my medical decision making, I reviewed the following data within the electronic MEDICAL RECORD NUMBER         Patient complains of sinus congestion and cough for 4 days.  Patient had negative Covid test 2 days ago.  Patient also complain of body aches.  Patient state unable to tolerate the side effects of Phenergan for her nausea.  Patient is [redacted] weeks gestation.  Patient complaint and physical exam consistent with viral respiratory infection and and hyperemesis secondary to gas station.  Advised to discontinue Phenergan and will give a prescription for Reglan.  Continue Robitussin for cough and congestion.  Advised establish care with OB/GYN.       ____________________________________________   FINAL CLINICAL IMPRESSION(S) / ED DIAGNOSES  Final diagnoses:  Viral URI with cough  Nausea and vomiting in pregnancy prior to [redacted] weeks gestation     ED Discharge Orders  Ordered    metoCLOPramide (REGLAN) 10 MG tablet  Every 8 hours PRN        05/03/20 1112          *Please note:  SAMIRAH SCARPATI was evaluated in Emergency Department on 05/03/2020 for the symptoms described in the history of present illness. She was evaluated in the context of the global COVID-19 pandemic, which necessitated consideration that the patient might be at risk for infection with the SARS-CoV-2 virus that causes COVID-19. Institutional protocols and algorithms that pertain to the evaluation of patients at risk for COVID-19 are in a state of rapid change based on information released by regulatory bodies including the CDC and federal and state organizations. These policies and  algorithms were followed during the patient's care in the ED.  Some ED evaluations and interventions may be delayed as a result of limited staffing during and the pandemic.*   Note:  This document was prepared using Dragon voice recognition software and may include unintentional dictation errors.    Joni Reining, PA-C 05/03/20 1119    Dionne Bucy, MD 05/03/20 1234

## 2020-05-03 NOTE — Discharge Instructions (Signed)
Continue previous cough medicine.  Discontinue Phenergan.  Start Reglan for nausea and vomiting.  Must establish care with OB/GYN.

## 2020-05-03 NOTE — ED Notes (Signed)
See triage note  Presents with nasal and chest congestion with some body aches  States she is 8 weeks preg  Also having n/v d/t pregnancy  Was given phenergan for vomiting  No relief

## 2020-05-03 NOTE — ED Triage Notes (Signed)
C/o sinus congestion and cough since Thursday.  States had a negative covid test on Saturday.  Denies fever.  C/O general body aches.  AAOx3.  Skin warm and dry. NAD

## 2020-05-20 LAB — OB RESULTS CONSOLE RUBELLA ANTIBODY, IGM: Rubella: IMMUNE

## 2020-05-20 LAB — OB RESULTS CONSOLE HEPATITIS B SURFACE ANTIGEN: Hepatitis B Surface Ag: NEGATIVE

## 2020-05-20 LAB — OB RESULTS CONSOLE VARICELLA ZOSTER ANTIBODY, IGG: Varicella: IMMUNE

## 2020-05-20 LAB — OB RESULTS CONSOLE RPR: RPR: NONREACTIVE

## 2020-05-27 ENCOUNTER — Emergency Department
Admission: EM | Admit: 2020-05-27 | Discharge: 2020-05-27 | Disposition: A | Discharge status: Home / Self Care | Payer: Medicaid Other

## 2020-10-04 ENCOUNTER — Encounter: Payer: Self-pay | Admitting: Obstetrics and Gynecology

## 2020-10-04 ENCOUNTER — Other Ambulatory Visit: Payer: Self-pay

## 2020-10-04 ENCOUNTER — Observation Stay
Admission: EM | Admit: 2020-10-04 | Discharge: 2020-10-04 | Disposition: A | Discharge status: Home / Self Care | Payer: Medicaid Other | Attending: Obstetrics and Gynecology | Admitting: Obstetrics and Gynecology

## 2020-10-04 DIAGNOSIS — O368130 Decreased fetal movements, third trimester, not applicable or unspecified: Secondary | ICD-10-CM | POA: Diagnosis not present

## 2020-10-04 DIAGNOSIS — O368190 Decreased fetal movements, unspecified trimester, not applicable or unspecified: Secondary | ICD-10-CM | POA: Diagnosis present

## 2020-10-04 DIAGNOSIS — O3483 Maternal care for other abnormalities of pelvic organs, third trimester: Secondary | ICD-10-CM | POA: Diagnosis not present

## 2020-10-04 DIAGNOSIS — Z3A30 30 weeks gestation of pregnancy: Secondary | ICD-10-CM | POA: Diagnosis not present

## 2020-10-04 DIAGNOSIS — O26893 Other specified pregnancy related conditions, third trimester: Secondary | ICD-10-CM | POA: Diagnosis present

## 2020-10-04 LAB — WET PREP, GENITAL
Clue Cells Wet Prep HPF POC: NONE SEEN
Sperm: NONE SEEN
Trich, Wet Prep: NONE SEEN
WBC, Wet Prep HPF POC: NONE SEEN
Yeast Wet Prep HPF POC: NONE SEEN

## 2020-10-04 LAB — URINALYSIS, COMPLETE (UACMP) WITH MICROSCOPIC
Bilirubin Urine: NEGATIVE
Glucose, UA: NEGATIVE mg/dL
Hgb urine dipstick: NEGATIVE
Ketones, ur: NEGATIVE mg/dL
Leukocytes,Ua: NEGATIVE
Nitrite: NEGATIVE
Protein, ur: NEGATIVE mg/dL
Specific Gravity, Urine: 1.025 (ref 1.005–1.030)
pH: 6.5 (ref 5.0–8.0)

## 2020-10-04 NOTE — OB Triage Note (Signed)
Pt discharged home per order.  Pt stable and ambulatory and an After Visit Summary was printed and given to the patient. Discharge education completed with patient/family including follow up instructions, appointments, and medication list. Pt received labor and bleeding precautions. Patient able to verbalize understanding, all questions fully answered upon discharge. P Pt discharged home via personal vehicle with support person.

## 2020-10-04 NOTE — OB Triage Note (Signed)
During triage, pt has felt fetal movement x2.

## 2020-10-04 NOTE — Discharge Summary (Signed)
Patient ID: BRYLYN NOVAKOVICH MRN: [id number] DOB/AGE: 22/30/2002 22 y.o.  Admit date: 10/04/2020 Discharge date: 10/04/2020  Admission Diagnoses: 20yo G1P0 at [redacted]w[redacted]d with c/o sharp pain pelvic pain last night and decreased fetal movement.  No pain since being here and pt began feeling the baby move upon arrival.    Discharge Diagnoses: Pain resolved, RNST  Factors complicating pregnancy: 1. Large simple ovarian cyst 2. Low lying placenta  Resolved at anatomy US   Prenatal Procedures: NST  Consults: None  Significant Diagnostic Studies:  Results for orders placed or performed during the hospital encounter of 10/04/20 (from the past 168 hour(s))  Wet prep, genital   Collection Time: 10/04/20  9:56 AM   Specimen: Vaginal  Result Value Ref Range   Yeast Wet Prep HPF POC NONE SEEN NONE SEEN   Trich, Wet Prep NONE SEEN NONE SEEN   Clue Cells Wet Prep HPF POC NONE SEEN NONE SEEN   WBC, Wet Prep HPF POC NONE SEEN NONE SEEN   Sperm NONE SEEN   Urinalysis, Complete w Microscopic Vaginal   Collection Time: 10/04/20  9:56 AM  Result Value Ref Range   Color, Urine YELLOW YELLOW   APPearance CLEAR CLEAR   Specific Gravity, Urine 1.025 1.005 - 1.030   pH 6.5 5.0 - 8.0   Glucose, UA NEGATIVE NEGATIVE mg/dL   Hgb urine dipstick NEGATIVE NEGATIVE   Bilirubin Urine NEGATIVE NEGATIVE   Ketones, ur NEGATIVE NEGATIVE mg/dL   Protein, ur NEGATIVE NEGATIVE mg/dL   Nitrite NEGATIVE NEGATIVE   Leukocytes,Ua NEGATIVE NEGATIVE   RBC / HPF 0-5 0 - 5 RBC/hpf   WBC, UA 0-5 0 - 5 WBC/hpf   Bacteria, UA RARE (A) NONE SEEN   Squamous Epithelial / LPF 0-5 0 - 5   Mucus PRESENT     Treatments: none  Hospital Course:  This is a 20 y.o. G1P0000 with IUP at [redacted]w[redacted]d seen for sharpe pelvic pain and decreased fetal movement  Labs neg - see above  NST: FHR baseline: 125 bpm Variability: moderate Accelerations: yes Decelerations: none Category/reactivity: reactive TOCO: quiet SVE: deferred   She  was observed, fetal heart rate monitoring remained reassuring, and she had no signs/symptoms of preterm labor or other maternal-fetal concerns.  She was deemed stable for discharge to home with outpatient follow up.  Discharge Physical Exam:  BP 108/66 (BP Location: Left Arm)   Pulse 83   Temp 99 F (37.2 C) (Oral)   Resp 18   Ht 5\' 3"  (1.6 m)   Wt 77.6 kg   LMP 02/07/2020 (Approximate)   BMI 30.29 kg/m   General: NAD CV: RRR Pulm: nl effort ABD: s/nd/nt, gravid DVT Evaluation: LE non-ttp, no evidence of DVT on exam.       Discharge Condition: Stable  Disposition: Discharge disposition: 01-Home or Self Care       Allergies as of 10/04/2020       Reactions   Tramadol Other (See Comments)   Fleet Enema [enema]    Miralax [polyethylene Glycol]    Bactrim [sulfamethoxazole-trimethoprim] Rash   Contrast Media [iodinated Diagnostic Agents] Rash   Hydroxyzine Rash   Sodium Phosphates Rash   Sulfa Antibiotics Rash        Medication List     STOP taking these medications    acetaminophen 500 MG tablet Commonly known as: TYLENOL   albuterol 108 (90 Base) MCG/ACT inhaler Commonly known as: VENTOLIN HFA   fexofenadine 60 MG tablet Commonly known as: ALLEGRA  fluticasone 50 MCG/ACT nasal spray Commonly known as: FLONASE   Loratadine 10 MG Caps   metoCLOPramide 10 MG tablet Commonly known as: REGLAN   ZyrTEC Allergy 10 MG Caps Generic drug: Cetirizine HCl       TAKE these medications    prenatal multivitamin Tabs tablet Take 1 tablet by mouth daily at 12 noon.         SignedHaroldine Laws, CNM 10/04/2020 11:21 AM

## 2020-10-04 NOTE — OB Triage Note (Signed)
Pt presented to L/D triage with reported lower abdominal pain that began last night. She describes a sharp,intermittent pain, rated 8/10. She reports that nothing makes it better or worse. She also reports constant lower abdominal discomfort and decreased fetal movement. No bleeding or LOF.  Pt reports no urinary discomfort. Pt reports intercourse yesterday.  Monitors applied and assessing- FHT 130. RN palpated fetal movement-patient did not feel movement.  VSS.

## 2020-10-19 ENCOUNTER — Other Ambulatory Visit: Payer: Self-pay

## 2020-10-19 ENCOUNTER — Encounter: Payer: Self-pay | Admitting: Obstetrics and Gynecology

## 2020-10-19 ENCOUNTER — Observation Stay: Payer: Medicaid Other

## 2020-10-19 ENCOUNTER — Observation Stay
Admission: EM | Admit: 2020-10-19 | Discharge: 2020-10-19 | Disposition: A | Discharge status: Home / Self Care | Payer: Medicaid Other | Attending: Obstetrics and Gynecology | Admitting: Obstetrics and Gynecology

## 2020-10-19 DIAGNOSIS — O479 False labor, unspecified: Secondary | ICD-10-CM | POA: Diagnosis present

## 2020-10-19 DIAGNOSIS — Z3A35 35 weeks gestation of pregnancy: Secondary | ICD-10-CM | POA: Insufficient documentation

## 2020-10-19 DIAGNOSIS — O4700 False labor before 37 completed weeks of gestation, unspecified trimester: Principal | ICD-10-CM | POA: Insufficient documentation

## 2020-10-19 LAB — URINALYSIS, COMPLETE (UACMP) WITH MICROSCOPIC
Bacteria, UA: NONE SEEN
Bilirubin Urine: NEGATIVE
Glucose, UA: NEGATIVE mg/dL
Hgb urine dipstick: NEGATIVE
Ketones, ur: NEGATIVE mg/dL
Leukocytes,Ua: NEGATIVE
Nitrite: NEGATIVE
Protein, ur: NEGATIVE mg/dL
Specific Gravity, Urine: 1.008 (ref 1.005–1.030)
pH: 7 (ref 5.0–8.0)

## 2020-10-19 LAB — WET PREP, GENITAL
Clue Cells Wet Prep HPF POC: NONE SEEN
Sperm: NONE SEEN
Trich, Wet Prep: NONE SEEN
Yeast Wet Prep HPF POC: NONE SEEN

## 2020-10-19 NOTE — OB Triage Note (Signed)
Pt. reports to Labor & Delivery today with complaints of contractions q7 minutes since 0500. Pt. rates her pain 8/10, stating that it is in her lower abdomen as well as in her lower back. Pt. reports positive fetal movement; denies leaking of fluid or vaginal bleeding. Pt. 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 External [place] and T[place] applied; initial FHR 140bpm. RN palpated a pt.-reported ctx, but was unable to feel any tightening of the abdomen at that specific time. Pt. had several ctx (per Toco) with the RN in the room, which she did not feel. Vital signs WNL. Will continue to assess.

## 2020-10-19 NOTE — Discharge Summary (Signed)
Patient ID: Destiny Mckenzie MRN: [id number] DOB/AGE: [birth year] 20 y.o.  Admit date: 10/19/2020 Discharge date: 11/01/2020  Admission Diagnoses: 20 yo G1P0 at [redacted]w[redacted]d presents with UCs q7 minutes since 0500, she rates her pain 8/10. +FM and denies LOF or VB. Pt. denies recent intercourse.   Discharge Diagnoses: Occasional uterine irritability   Factors complicating pregnancy: 1. Large simple ovarian cyst  Prenatal Procedures: NST and ultrasound  Consults: None  Significant Diagnostic Studies:  Urinalysis, Complete w Microscopic Urine, Clean Catch     Status: Abnormal   Collection Time: 10/19/20 10:39 AM  Result Value Ref Range   Color, Urine STRAW (A) YELLOW   APPearance CLEAR (A) CLEAR   Specific Gravity, Urine 1.008 1.005 - 1.030   pH 7.0 5.0 - 8.0   Glucose, UA NEGATIVE NEGATIVE mg/dL   Hgb urine dipstick NEGATIVE NEGATIVE   Bilirubin Urine NEGATIVE NEGATIVE   Ketones, ur NEGATIVE NEGATIVE mg/dL   Protein, ur NEGATIVE NEGATIVE mg/dL   Nitrite NEGATIVE NEGATIVE   Leukocytes,Ua NEGATIVE NEGATIVE   RBC / HPF 0-5 0 - 5 RBC/hpf   WBC, UA 0-5 0 - 5 WBC/hpf   Bacteria, UA NONE SEEN NONE SEEN   Squamous Epithelial / LPF 0-5 0 - 5   Mucus PRESENT     Comment: Performed at [facility], [address] [place], [place] [address]  Wet prep, genital     Status: Abnormal   Collection Time: 10/19/20 10:39 AM   Specimen: Vaginal  Result Value Ref Range   Yeast Wet Prep HPF POC NONE SEEN NONE SEEN   Trich, Wet Prep NONE SEEN NONE SEEN   Clue Cells Wet Prep HPF POC NONE SEEN NONE SEEN   WBC, Wet Prep HPF POC RARE (A) NONE SEEN   Sperm NONE SEEN     Comment: Performed at [facility], [address] [place], [place] [address]    EXAM: LIMITED OBSTETRIC ULTRASOUND AND TRANSVAGINAL OBSTETRIC ULTRASOUND CLINICAL DATA:  Preterm labor at 32.5 weeks. FINDINGS: Number of Fetuses: 1 Heart Rate:  129 bpm Movement: Yes Presentation: Cephalic Previa: No Placental  Location: Anterior Amniotic Fluid (Subjective): Within normal limits AFI 11.5 cm BPD:  8.7cm 35w 0d Maternal Findings: Cervix:  3.6 cm TV Uterus/Adnexae: No abnormality visualized. IMPRESSION: Single living intrauterine fetus in cephalic presentation. Normal cervical length.  No evidence of placenta previa.   Treatments: none  Hospital Course:  This is a 20 y.o. G1P0000 with IUP at [redacted]w[redacted]d seen for UCs q7 minutes since 0500, she rates her pain 8/10, noted to have a cervical exam of Closed/Thick/High, ballontable.  No leaking of fluid and no bleeding.  Labs and u/s results above.  She was observed, fetal heart rate monitoring remained reassuring, and she had no signs/symptoms of preterm labor or other maternal-fetal concerns.  She was deemed stable for discharge to home with outpatient follow up.  Discharge Physical Exam:  BP (!) 94/56 (BP Location: Left Arm)   Pulse 70   Temp 98.4 F (36.9 C) (Oral)   Resp 15   LMP 02/07/2020 (Approximate)   General: NAD CV: RRR Pulm: nl effort ABD: s/nd/nt, gravid DVT Evaluation: LE non-ttp, no evidence of DVT on exam.  NST: FHR baseline: 120 bpm Variability: moderate Accelerations: yes Decelerations: none Category/reactivity: reactive Amniotic Fluid Index: 11.5 cm  TOCO: occasional, most not felt by pt SVE:  Dilation: Closed Effacement (%): Thick Station: Ballotable Exam by:: [name], CNM   Discharge Condition: Stable  Disposition:  Discharge disposition:  01-Home or Self Care       Allergies as of 10/19/2020       Reactions   Tramadol Other (See Comments)   Fleet Enema [enema]    Miralax [polyethylene Glycol]    Bactrim [sulfamethoxazole-trimethoprim] Rash   Contrast Media [iodinated Diagnostic Agents] Rash   Hydroxyzine Rash   Sodium Phosphates Rash   Sulfa Antibiotics Rash        Medication List     TAKE these medications    prenatal multivitamin Tabs tablet Take 1 tablet by mouth daily at 12 noon.         Follow-up Information     [facility] Follow up.   Why: keep already scheduled appts Contact information: [address] [place] [address] [phone]                Signed:  [name], CNM 11/01/2020 10:20 AM

## 2020-11-06 ENCOUNTER — Encounter: Payer: Self-pay | Admitting: Obstetrics and Gynecology

## 2020-11-06 ENCOUNTER — Other Ambulatory Visit: Payer: Self-pay

## 2020-11-06 ENCOUNTER — Observation Stay
Admission: EM | Admit: 2020-11-06 | Discharge: 2020-11-06 | Disposition: A | Discharge status: Home / Self Care | Payer: Medicaid Other | Attending: Obstetrics and Gynecology | Admitting: Obstetrics and Gynecology

## 2020-11-06 DIAGNOSIS — Z3A35 35 weeks gestation of pregnancy: Secondary | ICD-10-CM | POA: Insufficient documentation

## 2020-11-06 DIAGNOSIS — O26893 Other specified pregnancy related conditions, third trimester: Secondary | ICD-10-CM | POA: Diagnosis present

## 2020-11-06 DIAGNOSIS — L299 Pruritus, unspecified: Secondary | ICD-10-CM | POA: Insufficient documentation

## 2020-11-06 LAB — COMPREHENSIVE METABOLIC PANEL
ALT: 49 U/L — ABNORMAL HIGH (ref 0–44)
AST: 31 U/L (ref 15–41)
Albumin: 2.9 g/dL — ABNORMAL LOW (ref 3.5–5.0)
Alkaline Phosphatase: 192 U/L — ABNORMAL HIGH (ref 38–126)
Anion gap: 6 (ref 5–15)
BUN: 6 mg/dL (ref 6–20)
CO2: 21 mmol/L — ABNORMAL LOW (ref 22–32)
Calcium: 8.6 mg/dL — ABNORMAL LOW (ref 8.9–10.3)
Chloride: 108 mmol/L (ref 98–111)
Creatinine, Ser: 0.74 mg/dL (ref 0.44–1.00)
GFR, Estimated: >60 mL/min (ref >60)
Glucose, Bld: 106 mg/dL — ABNORMAL HIGH (ref 70–99)
Potassium: 3.7 mmol/L (ref 3.5–5.1)
Sodium: 135 mmol/L (ref 135–145)
Total Bilirubin: 0.8 mg/dL (ref 0.3–1.2)
Total Protein: 6.2 g/dL — ABNORMAL LOW (ref 6.5–8.1)

## 2020-11-06 MED ORDER — URSODIOL 300 MG PO CAPS: 300.0000 mg | ORAL_CAPSULE | Freq: Three times a day (TID) | ORAL | 1 refills | Status: DC

## 2020-11-06 NOTE — Discharge Summary (Signed)
Patient ID: Destiny Mckenzie MRN: [id number] DOB/AGE: January 07, 2001 20 y.o.  Admit date: 11/06/2020 Discharge date: 11/06/2020  Admission Diagnoses: 20yo G1P0 at [redacted]w[redacted]d presents to triage for severe itching all over including palms and souls of her feet.    Discharge Diagnoses: Probably cholestasis, labs pending  Factors complicating pregnancy: Ovarian cyst   Prenatal Procedures: NST  Consults: None  Significant Diagnostic Studies:  Results for orders placed or performed during the hospital encounter of 11/06/20 (from the past 168 hour(s))  Comprehensive metabolic panel   Collection Time: 11/06/20  4:08 PM  Result Value Ref Range   Sodium 135 135 - 145 mmol/L   Potassium 3.7 3.5 - 5.1 mmol/L   Chloride 108 98 - 111 mmol/L   CO2 21 (L) 22 - 32 mmol/L   Glucose, Bld 106 (H) 70 - 99 mg/dL   BUN 6 6 - 20 mg/dL   Creatinine, Ser 5.97 0.44 - 1.00 mg/dL   Calcium 8.6 (L) 8.9 - 10.3 mg/dL   Total Protein 6.2 (L) 6.5 - 8.1 g/dL   Albumin 2.9 (L) 3.5 - 5.0 g/dL   AST 31 15 - 41 U/L   ALT 49 (H) 0 - 44 U/L   Alkaline Phosphatase 192 (H) 38 - 126 U/L   Total Bilirubin 0.8 0.3 - 1.2 mg/dL   GFR, Estimated >41 >63 mL/min   Anion gap 6 5 - 15    Treatments: none  Hospital Course:  This is a 20 y.o. G1P0000 with IUP at [redacted]w[redacted]d seen with severe itching and cholestasis workup.  NST was reactive. Labs collected.   She was observed, fetal heart rate monitoring remained reassuring, and the itching continues.  C/w Dr. Valentino Saxon. She was deemed stable for discharge to home with outpatient follow up.  Discharge Physical Exam:  BP 116/73 (BP Location: Left Arm)   Temp 98.3 F (36.8 C) (Oral)   Resp 16   Ht 5\' 3"  (1.6 m)   Wt 81.6 kg   LMP 02/07/2020 (Approximate)   BMI 31.89 kg/m   General: NAD CV: RRR Pulm: nl effort ABD: s/nd/nt, gravid DVT Evaluation: LE non-ttp, no evidence of DVT on exam.  NST: FHR baseline: 135 bpm Variability: moderate Accelerations: yes Decelerations:  none Time: 30+ minutes Category/reactivity: reactive  TOCO: quiet SVE: deferred      Discharge Condition: Stable  Disposition:  Discharge disposition: 01-Home or Self Care       Allergies as of 11/06/2020       Reactions   Tramadol Other (See Comments)   Fleet Enema [enema]    Miralax [polyethylene Glycol]    Bactrim [sulfamethoxazole-trimethoprim] Rash   Contrast Media [iodinated Diagnostic Agents] Rash   Hydroxyzine Rash   Sodium Phosphates Rash   Sulfa Antibiotics Rash        Medication List     TAKE these medications    cetirizine 10 MG tablet Commonly known as: ZYRTEC Take 10 mg by mouth daily.   prenatal multivitamin Tabs tablet Take 1 tablet by mouth daily at 12 noon.   ursodiol 300 MG capsule Commonly known as: ACTIGALL Take 1 capsule (300 mg total) by mouth 3 (three) times daily with meals.         Signed10/17/2022, CNM 11/06/2020 5:26 PM

## 2020-11-06 NOTE — OB Triage Note (Signed)
Pt is a G1P0 at [redacted]w[redacted]d that presents from ED with c/o itching in hands and feet that started a couple of weeks ago. Pt states today the itching is all over and she took an antihistamine at home but it only made her sleepy. Pt states positive FM, Denies VB, LOF. EFM applied and initial fht 135.

## 2020-11-06 NOTE — OB Triage Note (Signed)
Patient Discharged home per provider. Pt educated about labor precautions and informed when to return to the ED for further evaluation. Pt instructed to keep all follow up appointments with her provider. AVS given to patient and RN answered all questions and patient has no further questions at this time. Pt discharged home in stable condition with significant other.   

## 2020-11-07 LAB — BILE ACIDS, TOTAL: Bile Acids Total: 15.5 umol/L — ABNORMAL HIGH (ref 0.0–10.0)

## 2020-11-09 NOTE — Progress Notes (Signed)
Looks like patient has a mild elevation of her bile acids. Likely getting some cholestasis of pregnancy.

## 2020-11-15 LAB — OB RESULTS CONSOLE GC/CHLAMYDIA
Chlamydia: NEGATIVE
Gonorrhea: NEGATIVE

## 2020-11-15 LAB — OB RESULTS CONSOLE GBS: GBS: NEGATIVE

## 2020-11-15 LAB — OB RESULTS CONSOLE HIV ANTIBODY (ROUTINE TESTING): HIV: NONREACTIVE

## 2020-11-16 ENCOUNTER — Other Ambulatory Visit: Payer: Self-pay | Admitting: Obstetrics and Gynecology

## 2020-11-16 NOTE — Progress Notes (Signed)
Destiny Mckenzie is a 20 y.o. G1P0000 female at [redacted]w[redacted]d dated by 6 week u/s.  She presents to L&D for IOL for ICP  Pregnancy Issues: 1. ICP   Prenatal care site: [facility]     Pertinent Results:  Prenatal Labs: Blood type/Rh O pos  Antibody screen neg  Rubella Immune  Varicella Immune  RPR NR  HBsAg Neg  HIV NR  GC neg  Chlamydia neg  Genetic screening negative  1 hour GTT 91  3 hour GTT   GBS pending    5. Post Partum Planning: - Infant feeding: Bottle - Contraception: TBD - Tdap not given in [facility] setting - Flu not given in [facility] setting  Anjolie Majer, CNM 11/16/2020 5:22 PM

## 2020-11-18 ENCOUNTER — Other Ambulatory Visit: Payer: Self-pay

## 2020-11-18 ENCOUNTER — Other Ambulatory Visit
Admission: RE | Admit: 2020-11-18 | Discharge: 2020-11-18 | Disposition: A | Discharge status: Home / Self Care | Payer: Medicaid Other | Source: Ambulatory Visit | Attending: Obstetrics and Gynecology | Admitting: Obstetrics and Gynecology

## 2020-11-18 DIAGNOSIS — Z20822 Contact with and (suspected) exposure to covid-19: Secondary | ICD-10-CM | POA: Diagnosis not present

## 2020-11-18 DIAGNOSIS — Z01812 Encounter for preprocedural laboratory examination: Secondary | ICD-10-CM | POA: Insufficient documentation

## 2020-11-19 LAB — SARS CORONAVIRUS 2 (TAT 6-24 HRS): SARS Coronavirus 2: NEGATIVE

## 2020-11-20 ENCOUNTER — Inpatient Hospital Stay: Payer: Medicaid Other | Admitting: Pediatrics

## 2020-11-20 ENCOUNTER — Inpatient Hospital Stay
Admission: EM | Admit: 2020-11-20 | Discharge: 2020-11-22 | DRG: 805 | Disposition: A | Discharge status: Home / Self Care | Payer: Medicaid Other | Attending: Obstetrics and Gynecology | Admitting: Obstetrics and Gynecology

## 2020-11-20 ENCOUNTER — Other Ambulatory Visit: Payer: Self-pay

## 2020-11-20 ENCOUNTER — Encounter: Payer: Self-pay | Admitting: Obstetrics and Gynecology

## 2020-11-20 DIAGNOSIS — Z3A37 37 weeks gestation of pregnancy: Secondary | ICD-10-CM

## 2020-11-20 DIAGNOSIS — O2662 Liver and biliary tract disorders in childbirth: Principal | ICD-10-CM | POA: Diagnosis present

## 2020-11-20 DIAGNOSIS — Z87891 Personal history of nicotine dependence: Secondary | ICD-10-CM

## 2020-11-20 DIAGNOSIS — K831 Obstruction of bile duct: Secondary | ICD-10-CM | POA: Diagnosis present

## 2020-11-20 DIAGNOSIS — O26613 Liver and biliary tract disorders in pregnancy, third trimester: Secondary | ICD-10-CM | POA: Diagnosis present

## 2020-11-20 DIAGNOSIS — Z8719 Personal history of other diseases of the digestive system: Secondary | ICD-10-CM | POA: Diagnosis present

## 2020-11-20 LAB — CBC
HCT: 33.7 % — ABNORMAL LOW (ref 36.0–46.0)
Hemoglobin: 11.7 g/dL — ABNORMAL LOW (ref 12.0–15.0)
MCH: 31.9 pg (ref 26.0–34.0)
MCHC: 34.7 g/dL (ref 30.0–36.0)
MCV: 91.8 fL (ref 80.0–100.0)
Platelets: 265 10*3/uL (ref 150–400)
RBC: 3.67 MIL/uL — ABNORMAL LOW (ref 3.87–5.11)
RDW: 12.2 % (ref 11.5–15.5)
WBC: 9.8 10*3/uL (ref 4.0–10.5)
nRBC: 0 % (ref 0.0–0.2)

## 2020-11-20 LAB — COMPREHENSIVE METABOLIC PANEL
ALT: 64 U/L — ABNORMAL HIGH (ref 0–44)
AST: 47 U/L — ABNORMAL HIGH (ref 15–41)
Albumin: 3 g/dL — ABNORMAL LOW (ref 3.5–5.0)
Alkaline Phosphatase: 194 U/L — ABNORMAL HIGH (ref 38–126)
Anion gap: 7 (ref 5–15)
BUN: 6 mg/dL (ref 6–20)
CO2: 21 mmol/L — ABNORMAL LOW (ref 22–32)
Calcium: 9 mg/dL (ref 8.9–10.3)
Chloride: 107 mmol/L (ref 98–111)
Creatinine, Ser: 0.8 mg/dL (ref 0.44–1.00)
GFR, Estimated: >60 mL/min (ref >60)
Glucose, Bld: 92 mg/dL (ref 70–99)
Potassium: 3.5 mmol/L (ref 3.5–5.1)
Sodium: 135 mmol/L (ref 135–145)
Total Bilirubin: 0.6 mg/dL (ref 0.3–1.2)
Total Protein: 6.7 g/dL (ref 6.5–8.1)

## 2020-11-20 LAB — TYPE AND SCREEN
ABO/RH(D): O POS
Antibody Screen: NEGATIVE

## 2020-11-20 LAB — RPR: RPR Ser Ql: NONREACTIVE

## 2020-11-20 LAB — ABO/RH: ABO/RH(D): O POS

## 2020-11-20 MED ORDER — TETANUS-DIPHTH-ACELL PERTUSSIS 5-2.5-18.5 LF-MCG/0.5 IM SUSY
0.5000 mL | PREFILLED_SYRINGE | Freq: Once | INTRAMUSCULAR | Status: DC
Start: 2020-11-21 — End: 2020-11-23
  Filled 2020-11-20: qty 0.5

## 2020-11-20 MED ORDER — EPHEDRINE 5 MG/ML INJ: 10.0000 mg | INTRAVENOUS | Status: DC | PRN

## 2020-11-20 MED ORDER — ONDANSETRON HCL 4 MG/2ML IJ SOLN: 4.0000 mg | INTRAMUSCULAR | Status: DC | PRN

## 2020-11-20 MED ORDER — FENTANYL-BUPIVACAINE-NACL 0.5-0.125-0.9 MG/250ML-% EP SOLN
EPIDURAL | Status: DC | PRN
  Administered 2020-11-20: 12 mL/h via EPIDURAL

## 2020-11-20 MED ORDER — OXYTOCIN-SODIUM CHLORIDE 30-0.9 UT/500ML-% IV SOLN
1.0000 m[IU]/min | INTRAVENOUS | Status: DC
  Administered 2020-11-20: 2 m[IU]/min via INTRAVENOUS

## 2020-11-20 MED ORDER — MISOPROSTOL 25 MCG QUARTER TABLET
25.0000 ug | ORAL_TABLET | ORAL | Status: DC | PRN
  Administered 2020-11-20: 25 ug via VAGINAL
  Filled 2020-11-20 (×2): qty 1

## 2020-11-20 MED ORDER — LACTATED RINGERS IV SOLN
500.0000 mL | Freq: Once | INTRAVENOUS | Status: AC
  Administered 2020-11-20: 500 mL via INTRAVENOUS

## 2020-11-20 MED ORDER — OXYTOCIN-SODIUM CHLORIDE 30-0.9 UT/500ML-% IV SOLN
2.5000 [IU]/h | INTRAVENOUS | Status: DC
  Filled 2020-11-20: qty 500

## 2020-11-20 MED ORDER — WITCH HAZEL-GLYCERIN EX PADS
1.0000 "application " | MEDICATED_PAD | CUTANEOUS | Status: DC | PRN
  Administered 2020-11-21: 1 via TOPICAL
  Filled 2020-11-20: qty 100

## 2020-11-20 MED ORDER — BUPIVACAINE HCL (PF) 0.25 % IJ SOLN
INTRAMUSCULAR | Status: DC | PRN
  Administered 2020-11-20: 5 mL via EPIDURAL
  Administered 2020-11-20: 6 mL via EPIDURAL
  Administered 2020-11-20: 5 mL via EPIDURAL

## 2020-11-20 MED ORDER — SENNOSIDES-DOCUSATE SODIUM 8.6-50 MG PO TABS
2.0000 | ORAL_TABLET | Freq: Every day | ORAL | Status: DC
  Administered 2020-11-21 – 2020-11-22 (×2): 2 via ORAL
  Filled 2020-11-20 (×2): qty 2

## 2020-11-20 MED ORDER — PHENYLEPHRINE 40 MCG/ML (10ML) SYRINGE FOR IV PUSH (FOR BLOOD PRESSURE SUPPORT): 80.0000 ug | PREFILLED_SYRINGE | INTRAVENOUS | Status: DC | PRN

## 2020-11-20 MED ORDER — LACTATED RINGERS IV SOLN
INTRAVENOUS | Status: DC
Start: 2020-11-20 — End: 2020-11-20

## 2020-11-20 MED ORDER — FENTANYL-BUPIVACAINE-NACL 0.5-0.125-0.9 MG/250ML-% EP SOLN
EPIDURAL | Status: AC
  Filled 2020-11-20: qty 250

## 2020-11-20 MED ORDER — ONDANSETRON HCL 4 MG PO TABS
4.0000 mg | ORAL_TABLET | ORAL | Status: DC | PRN
  Filled 2020-11-20: qty 1

## 2020-11-20 MED ORDER — ACETAMINOPHEN 325 MG PO TABS
650.0000 mg | ORAL_TABLET | ORAL | Status: DC | PRN
  Administered 2020-11-20 – 2020-11-22 (×5): 650 mg via ORAL
  Filled 2020-11-20 (×5): qty 2

## 2020-11-20 MED ORDER — PHENYLEPHRINE 40 MCG/ML (10ML) SYRINGE FOR IV PUSH (FOR BLOOD PRESSURE SUPPORT)
80.0000 ug | PREFILLED_SYRINGE | INTRAVENOUS | Status: DC | PRN
Start: 2020-11-20 — End: 2020-11-20

## 2020-11-20 MED ORDER — COCONUT OIL OIL
1.0000 "application " | TOPICAL_OIL | Status: DC | PRN
  Filled 2020-11-20: qty 120

## 2020-11-20 MED ORDER — MISOPROSTOL 200 MCG PO TABS
ORAL_TABLET | ORAL | Status: AC
  Administered 2020-11-20: 25 ug via VAGINAL
  Filled 2020-11-20: qty 4

## 2020-11-20 MED ORDER — ACETAMINOPHEN 325 MG PO TABS: 650.0000 mg | ORAL_TABLET | ORAL | Status: DC | PRN

## 2020-11-20 MED ORDER — FENTANYL CITRATE (PF) 100 MCG/2ML IJ SOLN
100.0000 ug | INTRAMUSCULAR | Status: DC | PRN
  Administered 2020-11-20: 100 ug via INTRAVENOUS
  Filled 2020-11-20: qty 2

## 2020-11-20 MED ORDER — LIDOCAINE-EPINEPHRINE (PF) 1.5 %-1:200000 IJ SOLN
INTRAMUSCULAR | Status: DC | PRN
  Administered 2020-11-20: 3 mL via EPIDURAL

## 2020-11-20 MED ORDER — BENZOCAINE-MENTHOL 20-0.5 % EX AERO
1.0000 "application " | INHALATION_SPRAY | CUTANEOUS | Status: DC | PRN
  Administered 2020-11-21: 1 via TOPICAL
  Filled 2020-11-20 (×2): qty 56

## 2020-11-20 MED ORDER — AMMONIA AROMATIC IN INHA
RESPIRATORY_TRACT | Status: AC
  Filled 2020-11-20: qty 10

## 2020-11-20 MED ORDER — PRENATAL MULTIVITAMIN CH
1.0000 | ORAL_TABLET | Freq: Every day | ORAL | Status: DC
Start: 2020-11-21 — End: 2020-11-23
  Administered 2020-11-21 – 2020-11-22 (×2): 1 via ORAL
  Filled 2020-11-20 (×2): qty 1

## 2020-11-20 MED ORDER — SIMETHICONE 80 MG PO CHEW
80.0000 mg | CHEWABLE_TABLET | ORAL | Status: DC | PRN
  Administered 2020-11-20: 80 mg via ORAL
  Filled 2020-11-20: qty 1

## 2020-11-20 MED ORDER — ONDANSETRON HCL 4 MG/2ML IJ SOLN
4.0000 mg | Freq: Four times a day (QID) | INTRAMUSCULAR | Status: DC | PRN
  Administered 2020-11-20: 4 mg via INTRAVENOUS
  Filled 2020-11-20: qty 2

## 2020-11-20 MED ORDER — DIPHENHYDRAMINE HCL 50 MG/ML IJ SOLN: 12.5000 mg | INTRAMUSCULAR | Status: DC | PRN

## 2020-11-20 MED ORDER — OXYTOCIN 10 UNIT/ML IJ SOLN
INTRAMUSCULAR | Status: AC
  Filled 2020-11-20: qty 2

## 2020-11-20 MED ORDER — OXYTOCIN BOLUS FROM INFUSION
333.0000 mL | Freq: Once | INTRAVENOUS | Status: AC
  Administered 2020-11-20: 333 mL via INTRAVENOUS

## 2020-11-20 MED ORDER — TERBUTALINE SULFATE 1 MG/ML IJ SOLN: 0.2500 mg | Freq: Once | INTRAMUSCULAR | Status: DC | PRN

## 2020-11-20 MED ORDER — DIBUCAINE (PERIANAL) 1 % EX OINT
1.0000 | TOPICAL_OINTMENT | CUTANEOUS | Status: DC | PRN
Start: 2020-11-20 — End: 2020-11-23

## 2020-11-20 MED ORDER — LACTATED RINGERS IV SOLN: 500.0000 mL | INTRAVENOUS | Status: DC | PRN

## 2020-11-20 MED ORDER — IBUPROFEN 600 MG PO TABS
600.0000 mg | ORAL_TABLET | Freq: Four times a day (QID) | ORAL | Status: DC
Start: 2020-11-21 — End: 2020-11-23
  Administered 2020-11-20 – 2020-11-22 (×8): 600 mg via ORAL
  Filled 2020-11-20 (×8): qty 1

## 2020-11-20 MED ORDER — ZOLPIDEM TARTRATE 5 MG PO TABS: 5.0000 mg | ORAL_TABLET | Freq: Every evening | ORAL | Status: DC | PRN

## 2020-11-20 MED ORDER — FENTANYL-BUPIVACAINE-NACL 0.5-0.125-0.9 MG/250ML-% EP SOLN: 12.0000 mL/h | EPIDURAL | Status: DC | PRN

## 2020-11-20 MED ORDER — DIPHENHYDRAMINE HCL 25 MG PO CAPS: 25.0000 mg | ORAL_CAPSULE | Freq: Four times a day (QID) | ORAL | Status: DC | PRN

## 2020-11-20 MED ORDER — LIDOCAINE HCL (PF) 1 % IJ SOLN
30.0000 mL | INTRAMUSCULAR | Status: DC | PRN
Start: 2020-11-20 — End: 2020-11-20
  Filled 2020-11-20: qty 30

## 2020-11-20 MED ORDER — MEDROXYPROGESTERONE ACETATE 150 MG/ML IM SUSP
150.0000 mg | INTRAMUSCULAR | Status: DC | PRN
  Filled 2020-11-20: qty 1

## 2020-11-20 NOTE — Progress Notes (Signed)
Labor Progress Note  Destiny Mckenzie is a 20 y.o. G1P0000 at [redacted]w[redacted]d by ultrasound admitted for induction of labor due to Cholestasis.  Subjective: rectal pressure, hot spot on left lower abdomen. Unable to move her legs at all.   Objective: BP 118/76   Pulse 80   Temp 98.4 F (36.9 C) (Axillary)   Resp 17   Ht 5\' 3"  (1.6 m)   Wt 84.8 kg   LMP 02/07/2020 (Approximate)   SpO2 98%   BMI 33.13 kg/m  Notable VS details: reviewed  Fetal Assessment: FHT:  FHR: 115 bpm, variability: moderate,  accelerations:  Present,  decelerations:  Present early and variable decels with UCs    Category/reactivity:  Category II UC:   irregular, every 1.5-71min, PItocin remains off.  - MVUs 265 SVE:  9/100/+1soft/anterior.    Membrane status: AROM at 1433 Amniotic color: lg amount clear fluid    Assessment / Plan: IOL at 37.1wks for cholestasis of pregnancy  Labor: s/p 2 doses of cytotec, cook cath placed and now removed. S/p AROM, clear fluid. Pitocin currently off with cervical change. - IUPC baseline 15-25, adequate MVUs subjectively.  - anticipate 2nd stage labor soon, pt feeling slight rectal pressure with UCs. Preeclampsia:  no e/o pre-E Fetal Wellbeing:  Category II Pain Control:  Epidural- hot spot on lower left, but good anesthesia level.  I/D:  n/a Anticipated MOD:  NSVD  1m, CNM 11/20/2020, 7:35 PM

## 2020-11-20 NOTE — Progress Notes (Signed)
Labor Progress Note  Destiny Mckenzie is a 20 y.o. G1P0000 at [redacted]w[redacted]d by ultrasound admitted for induction of labor due to Cholestasis.  Subjective: feeling pressure and severe pain with cook cath. Per RN, balloon almost out but meets resistance at vaginal introitus.   Objective: BP 120/86 (BP Location: Left Arm)   Pulse 63   Temp 98.2 F (36.8 C) (Oral)   Resp 16   Ht 5\' 3"  (1.6 m)   Wt 84.8 kg   LMP 02/07/2020 (Approximate)   BMI 33.13 kg/m  Notable VS details: reviewed  Fetal Assessment: FHT:  FHR: 125 bpm, variability: moderate,  accelerations:  Present,  decelerations:  Absent Category/reactivity:  Category I UC:   irregular, every 2-48min, palp mild  SVE:  2/90/-1, soft/anterior.    1m cath removed intact, mod amount bloody show.  - BBOW  Membrane status: intact Amniotic color: n/a  Labs: Lab Results  Component Value Date   WBC 9.8 11/20/2020   HGB 11.7 (L) 11/20/2020   HCT 33.7 (L) 11/20/2020   MCV 91.8 11/20/2020   PLT 265 11/20/2020    Assessment / Plan: IOL at 37.1wks for cholestasis of pregnancy  Labor: s/p 2 doses of cytotec, cook cath placed and now removed. Plan for Pitocin now   Preeclampsia:  no e/o pre-E Fetal Wellbeing:  Category I Pain Control:  Labor support without medications I/D:  n/a Anticipated MOD:  NSVD  11/22/2020 Destiny Mckenzie, CNM 11/20/2020, 10:26 AM

## 2020-11-20 NOTE — Progress Notes (Signed)
Labor Progress Note  Destiny Mckenzie is a 20 y.o. G1P0000 at [redacted]w[redacted]d by ultrasound admitted for induction of labor due to Cholestasis.  Subjective: feeling menstrual like cramping, uncomfortable.   Objective: BP 120/86 (BP Location: Left Arm)   Pulse 63   Temp 98.2 F (36.8 C) (Oral)   Resp 16   Ht 5\' 3"  (1.6 m)   Wt 84.8 kg   LMP 02/07/2020 (Approximate)   BMI 33.13 kg/m  Notable VS details: reviewed  Fetal Assessment: FHT:  FHR: 125 bpm, variability: moderate,  accelerations:  Present,  decelerations:  Absent Category/reactivity:  Category I UC:   irregular, every 1-5 minutes SVE:   1/80/-2, soft/midposition.   - Cook cath placed, 84ml each uterine and vaginal   Membrane status: intact Amniotic color: n/a  Labs: Lab Results  Component Value Date   WBC 9.8 11/20/2020   HGB 11.7 (L) 11/20/2020   HCT 33.7 (L) 11/20/2020   MCV 91.8 11/20/2020   PLT 265 11/20/2020    Assessment / Plan: IOL at 37.1wks for cholestasis of pregnancy  Labor: s/p 2 doses of cytotec, cook cath placed. Plan for Pitocin at 4hrs after last cytotec.  - regular diet now.  - add on CMP  Preeclampsia:  no e/o pre-E Fetal Wellbeing:  Category I Pain Control:  Labor support without medications I/D:  n/a Anticipated MOD:  NSVD  11/22/2020, CNM 11/20/2020, 8:39 AM

## 2020-11-20 NOTE — H&P (Signed)
OB History & Physical   History of Present Illness:  Chief Complaint:   HPI:  Destiny Mckenzie is a 20 y.o. G1P0000 female at [redacted]w[redacted]d dated by U/S at 6 weeks.  She presents to L&D for IOL for Cholestasis   She reports:  -active fetal movement -no leakage of fluid -no vaginal bleeding -no contractions Pregnancy Issues: 1. ICP 2. Large simple ovarian cyst   Maternal Medical History:   Past Medical History:  Diagnosis Date   Allergy    Fracture of ankle, left, closed 2012 and 2013   Headache(784.0)    Mononucleosis jan 2014   Seasonal allergies     Past Surgical History:  Procedure Laterality Date   APPENDECTOMY     LAPAROSCOPIC APPENDECTOMY N/A 10/07/2013   Procedure: APPENDECTOMY LAPAROSCOPIC;  Surgeon: Dalia Heading, MD;  Location: [facility];  Service: General;  Laterality: N/A;   TONSILLECTOMY     TONSILLECTOMY  01/30/12    Allergies  Allergen Reactions   Tramadol Other (See Comments)   Fleet Enema [Enema]    Miralax [Polyethylene Glycol]    Bactrim [Sulfamethoxazole-Trimethoprim] Rash   Contrast Media [Iodinated Diagnostic Agents] Rash   Hydroxyzine Rash   Sodium Phosphates Rash   Sulfa Antibiotics Rash    Prior to Admission medications   Medication Sig Start Date End Date Taking? Authorizing Provider  cetirizine (ZYRTEC) 10 MG tablet Take 10 mg by mouth daily.    [provider]  Prenatal Vit-Fe Fumarate-FA (PRENATAL MULTIVITAMIN) TABS tablet Take 1 tablet by mouth daily at 12 noon.    [provider]  ursodiol (ACTIGALL) 300 MG capsule Take 1 capsule (300 mg total) by mouth 3 (three) times daily with meals. 11/06/20 12/11/20  [name], CNM     Prenatal care site: [facility]    Social History: She  reports that she quit smoking about 4 years ago. Her smoking use included cigarettes. She smoked an average of .3 packs per day. She has never used smokeless tobacco. She reports that she does not currently use alcohol. She  reports that she does not use drugs.  Family History: family history includes Obesity in her mother.   Review of Systems: A full review of systems was performed and negative except as noted in the HPI.    Physical Exam:  Vital Signs: BP 127/81   Pulse 78   Temp 98 F (36.7 C) (Oral)   Resp 18   Ht 5\' 3"  (1.6 m)   Wt 84.8 kg   LMP 02/07/2020 (Approximate)   BMI 33.13 kg/m   General:   alert and cooperative  Skin:  normal  Neurologic:    Alert & oriented x 3  Lungs:   clear to auscultation bilaterally  Heart:   regular rate and rhythm, S1, S2 normal, no murmur, click, rub or gallop  Abdomen:  soft, non-tender; bowel sounds normal; no masses,  no organomegaly and gravid  Pelvis:  Exam deferred.  FHT:  125 BPM  Presentations: cephalic  Cervix:    Dilation: 1cm   Effacement: 40%   Station:  -2   Consistency: firm   Position: posterior  Extremities: : non-tender, symmetric, no edema bilaterally.  DTRs: +2     Results for orders placed or performed during the hospital encounter of 11/20/20 (from the past 24 hour(s))  CBC     Status: Abnormal   Collection Time: 11/20/20  1:21 AM  Result Value Ref Range   WBC 9.8 4.0 - 10.5  K/uL   RBC 3.67 (L) 3.87 - 5.11 MIL/uL   Hemoglobin 11.7 (L) 12.0 - 15.0 g/dL   HCT 19.5 (L) 09.3 - 26.7 %   MCV 91.8 80.0 - 100.0 fL   MCH 31.9 26.0 - 34.0 pg   MCHC 34.7 30.0 - 36.0 g/dL   RDW 12.4 58.0 - 99.8 %   Platelets 265 150 - 400 K/uL   nRBC 0.0 0.0 - 0.2 %  Type and screen     Status: None   Collection Time: 11/20/20  1:21 AM  Result Value Ref Range   ABO/RH(D) O POS    Antibody Screen NEG    Sample Expiration      11/23/2020,2359 Performed at [facility], [address] [place], [place] [address]   ABO/Rh     Status: None   Collection Time: 11/20/20  3:57 AM  Result Value Ref Range   ABO/RH(D)      O POS Performed at [facility], [address] [place], [place] [address]     Pertinent Results:   Prenatal Labs: Blood type/Rh O Pos  Antibody screen neg  Rubella Immune  Varicella Immune  RPR NR  HBsAg Neg  HIV NR  GC neg  Chlamydia neg  Genetic screening negative  1 hour GTT 91  3 hour GTT   GBS Neg   FHT: FHR: 125 bpm, variability: moderate,  accelerations:  Present,  decelerations:  Absent Category/reactivity:  Category I TOCO: Contractions x2 SVE: Dilation 1 / Effacement (% )40/ Station -2   Cephalic by leopolds  No results found.   Assessment:  Destiny Mckenzie is a 20 y.o. G1P0000 female at [redacted]w[redacted]d with Cholestasis.   Plan:  1. Admit to Labor & Delivery; consents reviewed and obtained  2. Fetal Well being  - Fetal Tracing: Category 1 - GBS Neg - Presentation: cephalic confirmed by SVE   3. Routine OB: - Prenatal labs reviewed, as above - Rh pos - CBC & T&S on admit - Clear fluids, IVF  4. Monitoring of induction of Labor -  Contractions are mild every 4-6 mins external toco in place -  Plan for induction with Cytotec, Pitocin, AROM -  Plan for continuous fetal monitoring  -  Maternal pain control as desired - Anticipate vaginal delivery  5. Post Partum Planning: - Infant feeding: bottle - Contraception: undecided  Chari Manning, CNM 11/20/1240 AM

## 2020-11-20 NOTE — Progress Notes (Signed)
Labor Progress Note  Destiny Mckenzie is a 20 y.o. G1P0000 at [redacted]w[redacted]d by ultrasound admitted for induction of labor due to Cholestasis.  Subjective: more comfortable after redose from anesthesia.   Objective: BP 113/65   Pulse 62   Temp 98.4 F (36.9 C) (Oral)   Resp 14   Ht 5\' 3"  (1.6 m)   Wt 84.8 kg   LMP 02/07/2020 (Approximate)   SpO2 97%   BMI 33.13 kg/m  Notable VS details: reviewed  Fetal Assessment: FHT:  FHR: 135 bpm, variability: moderate,  accelerations:  Present,  decelerations:  Present early and variable decels with UCs    Category/reactivity:  Category II UC:   irregular, every 1.5-39min, PItocin remains off.  - MVUs 265 SVE:  6-7/100/-1 soft/anterior.    Membrane status: AROM at 1433 Amniotic color: lg amount clear fluid    Assessment / Plan: IOL at 37.1wks for cholestasis of pregnancy  Labor: s/p 2 doses of cytotec, cook cath placed and now removed. S/p AROM, clear fluid. Pitocin currently off with cervical change. - IUPC baseline 15-25 Preeclampsia:  no e/o pre-E Fetal Wellbeing:  Category I Pain Control:  Epidural I/D:  n/a Anticipated MOD:  NSVD  06-10-2005 Makailah Slavick, CNM 11/20/2020, 5:52 PM

## 2020-11-20 NOTE — Anesthesia Preprocedure Evaluation (Signed)
Anesthesia Evaluation  Patient identified by MRN, date of birth, ID band Patient awake    Reviewed: Allergy & Precautions, NPO status , Patient's Chart, lab work & pertinent test results  History of Anesthesia Complications Negative for: history of anesthetic complications  Airway   TM Distance: >3 FB    Comment:  Normal opening Dental   Pulmonary neg pulmonary ROS, former smoker,    Pulmonary exam normal        Cardiovascular Exercise Tolerance: Good negative cardio ROS Normal cardiovascular exam     Neuro/Psych  Headaches,  H/o lumbar radiculopathy negative psych ROS   GI/Hepatic negative GI ROS, Neg liver ROS,   Endo/Other  negative endocrine ROS  Renal/GU negative Renal ROS  negative genitourinary   Musculoskeletal negative musculoskeletal ROS (+)   Abdominal (+) + obese (BMI 33),   Peds negative pediatric ROS (+)  Hematology negative hematology ROS (+)   Anesthesia Other Findings Gravid uterus  Reproductive/Obstetrics (+) Pregnancy  20 y.o. G1P0000 at [redacted]w[redacted]d by ultrasound admitted for induction of labor due to cholestasis                             Anesthesia Physical Anesthesia Plan  ASA: 2  Anesthesia Plan: Epidural   Post-op Pain Management:    Induction:   PONV Risk Score and Plan:   Airway Management Planned:   Additional Equipment:   Intra-op Plan:   Post-operative Plan:   Informed Consent: I have reviewed the patients History and Physical, chart, labs and discussed the procedure including the risks, benefits and alternatives for the proposed anesthesia with the patient or authorized representative who has indicated his/her understanding and acceptance.       Plan Discussed with: Anesthesiologist  Anesthesia Plan Comments: (Plan for epidural for labor analgesia. Risks/side effects discussed w/ patient including bleeding, infection, headache,  hypotension, damage to surrounding structures. Patient would like to proceed.)        Anesthesia Quick Evaluation

## 2020-11-20 NOTE — Progress Notes (Signed)
Labor Progress Note  Destiny Mckenzie is a 20 y.o. G1P0000 at [redacted]w[redacted]d by ultrasound admitted for induction of labor due to Cholestasis.  Subjective: to bedside for FHR decels, not tracing continuously. S/p epidural recently.   Objective: BP 114/67   Pulse 75   Temp 98.4 F (36.9 C) (Oral)   Resp 16   Ht 5\' 3"  (1.6 m)   Wt 84.8 kg   LMP 02/07/2020 (Approximate)   SpO2 96%   BMI 33.13 kg/m  Notable VS details: reviewed  Fetal Assessment: FHT:  FHR: 125 bpm, variability: moderate,  accelerations:  Present,  decelerations:  Present prolonged FHR tracing 90s x 02/09/2020.   Now recovered to 125bpm, moderate variability and + accels.   - maternal position changed by RN - FSE and IUPC placed by CNM  Category/reactivity:  Category II UC:   irregular, every 1.5-17min, palp mild, pitocin at 4 mu/min SVE:  3/100/-1 soft/anterior.    Membrane status: AROM at 1433 Amniotic color: lg amount clear fluid    Assessment / Plan: IOL at 37.1wks for cholestasis of pregnancy  Labor: s/p 2 doses of cytotec, cook cath placed and now removed. S/p AROM, clear fluid. Pitocin currently at 59mu/min, DC during prolonged decel.   - IUPC assessing, baseline 30-53mmHg.  - CMP resulted, mild elevation of LFTs, will monitor.  - Dr 21m notified of FHR decels post epidural, will continue to monitor closely.  Preeclampsia:  no e/o pre-E Fetal Wellbeing:  Category I Pain Control:  Epidural I/D:  n/a Anticipated MOD:  NSVD  Destiny Mckenzie, CNM 11/20/2020, 4:25 PM

## 2020-11-20 NOTE — Progress Notes (Signed)
Labor Progress Note  Destiny Mckenzie is a 20 y.o. G1P0000 at [redacted]w[redacted]d by ultrasound admitted for induction of labor due to Cholestasis.  Subjective: more rectal pressure, hot spot on left lower abdomen. Unable to move her legs at all.   Objective: BP 118/76   Pulse 80   Temp 98.4 F (36.9 C) (Axillary)   Resp 17   Ht 5\' 3"  (1.6 m)   Wt 84.8 kg   LMP 02/07/2020 (Approximate)   SpO2 98%   BMI 33.13 kg/m  Notable VS details: reviewed  Fetal Assessment: FHT:  FHR: 115 bpm, variability: moderate,  accelerations:  Present,  decelerations:  Present early and variable decels with UCs    Category/reactivity:  Category II UC:   irregular, every 1.5-68min, PItocin remains off.  SVE:  10/100/+2 some caput  Membrane status: AROM at 1433 Amniotic color: lg amount clear fluid    Assessment / Plan: IOL at 37.1wks for cholestasis of pregnancy  Labor: s/p 2 doses of cytotec, cook cath placed and now removed. S/p AROM, clear fluid. Pitocin currently off with cervical change. - will start pushing now.  Preeclampsia:  no e/o pre-E Fetal Wellbeing:  Category II Pain Control:  Epidural- hot spot on lower left, but good anesthesia level.  I/D:  n/a Anticipated MOD:  NSVD  1m, CNM 11/20/2020, 8:24 PM

## 2020-11-20 NOTE — Discharge Summary (Signed)
Obstetrical Discharge Summary  Patient Name: Destiny Mckenzie DOB: Dec 26, 2000 MRN: [id number]  Date of Admission: 11/20/2020 Date of Delivery: 11/20/20 Delivered by: Heloise Ochoa CNM Date of Discharge: 11/22/2020  Primary OB: [facility]  EZM:[id number]'Y last menstrual period was 02/07/2020 (approximate). EDC Estimated Date of Delivery: 12/10/20 Gestational Age at Delivery: [redacted]w[redacted]d   Antepartum complications:  Cholestasis of pregnancy, rx Actigall.  Large simple cyst, initially 5.9cm, then 2.6cm at anatomy US.   Admitting Diagnosis:  Secondary Diagnosis: Patient Active Problem List   Diagnosis Date Noted   Cholestasis of pregnancy in third trimester 11/20/2020   Severe itching 11/06/2020   Uterine contractions 10/19/2020   Decreased fetal movement 10/04/2020   Encounter for supervision of normal pregnancy 04/21/2020   Low back pain 03/21/2019   Lumbar radiculopathy 02/18/2019    Augmentation: AROM, Pitocin, Cytotec, and Cook cath Complications: None Intrapartum complications/course: see delivery note Date of Delivery: 11/20/20 Delivered By: Heloise Ochoa CNM Delivery Type: spontaneous vaginal delivery Anesthesia: epidural Placenta: spontaneous Laceration: 1st deg vaginal lac Episiotomy: none Newborn Data: Live born female "Destiny Mckenzie" Birth Weight:  5lb 13oz APGAR: 8, 9  Newborn Delivery   Birth date/time: 11/20/2020 20:43:00 Delivery type: Vaginal, Spontaneous      Postpartum Procedures: none  Edinburgh:  Edinburgh Postnatal Depression Scale Screening Tool 11/21/2020 11/21/2020  I have been able to laugh and see the funny side of things. 0 (No Data)  I have looked forward with enjoyment to things. 0 -  I have blamed myself unnecessarily when things went wrong. 0 -  I have been anxious or worried for no good reason. 0 -  I have felt scared or panicky for no good reason. 0 -  Things have been getting on top of me. 0 -  I have been so unhappy that I  have had difficulty sleeping. 0 -  I have felt sad or miserable. 0 -  I have been so unhappy that I have been crying. 0 -  The thought of harming myself has occurred to me. 0 -  Edinburgh Postnatal Depression Scale Total 0 -     Post partum course:  Patient had an uncomplicated postpartum course.  By time of discharge on PPD#2, her pain was controlled on oral pain medications; she had appropriate lochia and was ambulating, voiding without difficulty and tolerating regular diet.  She was deemed stable for discharge to home.    Discharge Physical Exam:  BP (!) 135/91 (BP Location: Right Arm)   Pulse 68   Temp 98.9 F (37.2 C) (Oral)   Resp 17   Ht 5\' 3"  (1.6 m)   Wt 84.8 kg   LMP 02/07/2020 (Approximate)   SpO2 99%   Breastfeeding Unknown   BMI 33.13 kg/m   General: NAD CV: RRR Pulm: CTABL, nl effort ABD: s/nd/nt, fundus firm and below the umbilicus Lochia: moderate Perineum: well approximated DVT Evaluation: LE non-ttp, no evidence of DVT on exam.  Hemoglobin  Date Value Ref Range Status  11/22/2020 10.7 (L) 12.0 - 15.0 g/dL Final   HGB  Date Value Ref Range Status  02/09/2012 13.8 12.0 - 16.0 g/dL Final   HCT  Date Value Ref Range Status  11/22/2020 31.6 (L) 36.0 - 46.0 % Final  02/09/2012 41.2 35.0 - 47.0 % Final     Disposition: stable, discharge to home. Baby Feeding: breastmilk Baby Disposition: home with mom  Rh Immune globulin given: n/a Rubella vaccine given: immune Varicella vaccine given: immune Tdap vaccine given  in [facility] or PP setting: declined Flu vaccine given in [facility] or PP setting: declined  Contraception: Micronor  Prenatal Labs:  Blood type/Rh O Pos  Antibody screen neg  Rubella Immune  Varicella Immune  RPR NR  HBsAg Neg  HIV NR  GC neg  Chlamydia neg  Genetic screening negative  1 hour GTT 91  3 hour GTT    GBS Neg     Plan:  Eliezer Bottom was discharged to home in good condition. Follow-up appointment with delivering  provider in 2 weeks.  Discharge Medications: Allergies as of 11/22/2020       Reactions   Tramadol Other (See Comments)   Fleet Enema [enema]    Miralax [polyethylene Glycol]    Bactrim [sulfamethoxazole-trimethoprim] Rash   Contrast Media [iodinated Diagnostic Agents] Rash   Hydroxyzine Rash   Sodium Phosphates Rash   Sulfa Antibiotics Rash        Medication List     TAKE these medications    acetaminophen 325 MG tablet Commonly known as: Tylenol Take 2 tablets (650 mg total) by mouth every 4 (four) hours as needed (for pain scale < 4).   benzocaine-Menthol 20-0.5 % Aero Commonly known as: DERMOPLAST Apply 1 application topically as needed for irritation (perineal discomfort).   cetirizine 10 MG tablet Commonly known as: ZYRTEC Take 10 mg by mouth daily.   diphenhydrAMINE 25 mg capsule Commonly known as: BENADRYL Take 1 capsule (25 mg total) by mouth every 6 (six) hours as needed for itching.   ibuprofen 600 MG tablet Commonly known as: ADVIL Take 1 tablet (600 mg total) by mouth every 6 (six) hours.   prenatal multivitamin Tabs tablet Take 1 tablet by mouth daily at 12 noon.   senna-docusate 8.6-50 MG tablet Commonly known as: Senokot-S Take 2 tablets by mouth daily.   simethicone 80 MG chewable tablet Commonly known as: MYLICON Chew 1 tablet (80 mg total) by mouth as needed for flatulence.   witch hazel-glycerin pad Commonly known as: TUCKS Apply 1 application topically as needed for hemorrhoids.         Follow-up Information     McVey, Prudencio Pair, CNM Follow up in 2 week(s).   Specialty: Obstetrics and Gynecology Why: postpartum mood check Contact information: [address] [place] [address] [phone]                 Signed:  Janyce Llanos, CNM 11/22/2020  4:40 PM

## 2020-11-20 NOTE — Progress Notes (Signed)
Labor Progress Note  Destiny Mckenzie is a 20 y.o. G1P0000 at [redacted]w[redacted]d by ultrasound admitted for induction of labor due to Cholestasis.  Subjective: Painful UCs, increasing pain since dose of IVPM  Objective: BP 134/79   Pulse 72   Temp 98.4 F (36.9 C) (Oral)   Resp 16   Ht 5\' 3"  (1.6 m)   Wt 84.8 kg   LMP 02/07/2020 (Approximate)   BMI 33.13 kg/m  Notable VS details: reviewed  Fetal Assessment: FHT:  FHR: 130 bpm, variability: moderate,  accelerations:  Present,  decelerations:  Absent Category/reactivity:  Category I UC:   irregular, every 1.5-55min, palp mild, pitocin at 76mu/min SVE:  3/80/-1, soft/anterior.    Membrane status: AROM at 1433 Amniotic color: lg amount clear fluid   Labs: Lab Results  Component Value Date   WBC 9.8 11/20/2020   HGB 11.7 (L) 11/20/2020   HCT 33.7 (L) 11/20/2020   MCV 91.8 11/20/2020   PLT 265 11/20/2020   Component     Latest Ref Rng & Units 11/20/2020  Sodium     135 - 145 mmol/L 135  Potassium     3.5 - 5.1 mmol/L 3.5  Chloride     98 - 111 mmol/L 107  CO2     22 - 32 mmol/L 21 (L)  Glucose     70 - 99 mg/dL 92  BUN     6 - 20 mg/dL 6  Creatinine     11/22/2020 - 1.00 mg/dL 9.70  Calcium     8.9 - 10.3 mg/dL 9.0  Total Protein     6.5 - 8.1 g/dL 6.7  Albumin     3.5 - 5.0 g/dL 3.0 (L)  AST     15 - 41 U/L 47 (H)  ALT     0 - 44 U/L 64 (H)  Alkaline Phosphatase     38 - 126 U/L 194 (H)  Total Bilirubin     0.3 - 1.2 mg/dL 0.6  GFR, Estimated     >60 mL/min >60  Anion gap     5 - 15 7    Assessment / Plan: IOL at 37.1wks for cholestasis of pregnancy  Labor: s/p 2 doses of cytotec, cook cath placed and now removed. Pitocin infusing but UC pattern too close to titrate. AROM performed.  - CMP resulted, mild elevation of LFTs, will monitor.  Preeclampsia:  no e/o pre-E Fetal Wellbeing:  Category I Pain Control:  s/p IVPM x 1 dose. Considering epidural now.  I/D:  n/a Anticipated MOD:  NSVD  2.63,  CNM 11/20/2020, 2:36 PM

## 2020-11-20 NOTE — Anesthesia Procedure Notes (Signed)
Epidural Patient location during procedure: OB Start time: 11/20/2020 2:48 PM End time: 11/20/2020 3:07 PM  Staffing Anesthesiologist: Donato Schultz, MD Performed: anesthesiologist   Preanesthetic Checklist Completed: patient identified, IV checked, site marked, risks and benefits discussed, surgical consent, monitors and equipment checked, pre-op evaluation and timeout performed  Epidural Patient position: sitting Prep: ChloraPrep Patient monitoring: heart rate, continuous pulse ox and blood pressure Approach: midline Location: L3-L4 Injection technique: LOR saline  Needle:  Needle type: Tuohy  Needle gauge: 17 G Needle length: 9 cm and 9 Needle insertion depth: 5 cm Catheter type: closed end flexible Catheter size: 19 Gauge Catheter at skin depth: 11 cm Test dose: negative and 1.5% lidocaine with Epi 1:200 K  Assessment Sensory level: T10 Events: blood not aspirated, injection not painful, no injection resistance, no paresthesia and negative IV test  Additional Notes 1 attempt  Pt. Evaluated and documentation done after procedure finished. Patient identified. Risks/Benefits/Options discussed with patient including but not limited to bleeding, infection, nerve damage, paralysis, failed block, incomplete pain control, headache, blood pressure changes, nausea, vomiting, reactions to medication both or allergic, itching and postpartum back pain. Confirmed with bedside nurse the patient's most recent platelet count. Confirmed with patient that they are not currently taking any anticoagulation, have any bleeding history or any family history of bleeding disorders. Patient expressed understanding and wished to proceed. All questions were answered. Sterile technique was used throughout the entire procedure. Please see nursing notes for vital signs. Test dose was given through epidural catheter and negative prior to continuing to dose epidural or start infusion. Warning signs of  high block given to the patient including shortness of breath, tingling/numbness in hands, complete motor block, or any concerning symptoms with instructions to call for help. Patient was given instructions on fall risk and not to get out of bed. All questions and concerns addressed with instructions to call with any issues or inadequate analgesia.    Patient tolerated the insertion well without immediate complications.Reason for block:procedure for pain

## 2020-11-21 LAB — COMPREHENSIVE METABOLIC PANEL
ALT: 71 U/L — ABNORMAL HIGH (ref 0–44)
AST: 57 U/L — ABNORMAL HIGH (ref 15–41)
Albumin: 2.3 g/dL — ABNORMAL LOW (ref 3.5–5.0)
Alkaline Phosphatase: 140 U/L — ABNORMAL HIGH (ref 38–126)
Anion gap: 6 (ref 5–15)
BUN: 7 mg/dL (ref 6–20)
CO2: 21 mmol/L — ABNORMAL LOW (ref 22–32)
Calcium: 8.8 mg/dL — ABNORMAL LOW (ref 8.9–10.3)
Chloride: 110 mmol/L (ref 98–111)
Creatinine, Ser: 0.76 mg/dL (ref 0.44–1.00)
GFR, Estimated: >60 mL/min (ref >60)
Glucose, Bld: 77 mg/dL (ref 70–99)
Potassium: 3.7 mmol/L (ref 3.5–5.1)
Sodium: 137 mmol/L (ref 135–145)
Total Bilirubin: 0.7 mg/dL (ref 0.3–1.2)
Total Protein: 5.3 g/dL — ABNORMAL LOW (ref 6.5–8.1)

## 2020-11-21 LAB — CBC
HCT: 31.4 % — ABNORMAL LOW (ref 36.0–46.0)
Hemoglobin: 11.3 g/dL — ABNORMAL LOW (ref 12.0–15.0)
MCH: 32.8 pg (ref 26.0–34.0)
MCHC: 36 g/dL (ref 30.0–36.0)
MCV: 91.3 fL (ref 80.0–100.0)
Platelets: 228 10*3/uL (ref 150–400)
RBC: 3.44 MIL/uL — ABNORMAL LOW (ref 3.87–5.11)
RDW: 12.3 % (ref 11.5–15.5)
WBC: 15.7 10*3/uL — ABNORMAL HIGH (ref 4.0–10.5)
nRBC: 0 % (ref 0.0–0.2)

## 2020-11-21 NOTE — Anesthesia Postprocedure Evaluation (Signed)
Anesthesia Post Note  Patient: HAZLE OGBURN  Procedure(s) Performed: AN AD HOC LABOR EPIDURAL  Patient location during evaluation: Mother Baby Anesthesia Type: Epidural Level of consciousness: awake and alert Pain management: pain level controlled Vital Signs Assessment: post-procedure vital signs reviewed and stable Respiratory status: spontaneous breathing, nonlabored ventilation and respiratory function stable Cardiovascular status: stable Postop Assessment: no headache, no backache and epidural receding Anesthetic complications: no   No notable events documented.   Last Vitals:  Vitals:   11/21/20 0015 11/21/20 0340  BP: 123/76 111/74  Pulse: 73 69  Resp: 18 18  Temp: (!) 36.1 C 36.9 C  SpO2:      Last Pain:  Vitals:   11/21/20 0340  TempSrc: Axillary  PainSc:                  Donato Schultz

## 2020-11-21 NOTE — Progress Notes (Signed)
Post Partum Day 1 Subjective: Doing well, no complaints.  Tolerating regular diet, pain with PO meds, voiding and ambulating without difficulty.  No CP SOB Fever,Chills, N/V or leg pain; denies nipple or breast pain, no HA change of vision, RUQ/epigastric pain  Objective: BP 121/84 (BP Location: Left Arm)   Pulse 82   Temp 98.6 F (37 C) (Oral)   Resp 18   Ht 5\' 3"  (1.6 m)   Wt 84.8 kg   LMP 02/07/2020 (Approximate)   SpO2 99%   Breastfeeding Unknown   BMI 33.13 kg/m    Physical Exam:  General: NAD Breasts: soft/nontender CV: RRR Pulm: nl effort, CTABL Abdomen: soft, NT, BS x 4 Perineum: minimal edema, repair well approximated Lochia: moderate Uterine Fundus: fundus firm and 1 fb below umbilicus DVT Evaluation: no cords, ttp LEs   Recent Labs    11/20/20 0121 11/21/20 0509  HGB 11.7* 11.3*  HCT 33.7* 31.4*  WBC 9.8 15.7*  PLT 265 228    Assessment/Plan: 20 y.o. G1P1001 postpartum day # 1 S/p IOL cholestasis of pregnancy  - Continue routine PP care - Encouraged snug fitting bra and cabbage leaves for bottlefeeding.  - Discussed contraceptive options including implant, IUDs hormonal and non-hormonal, injection, pills/ring/patch, condoms, and NFP.  - leukocytosis- afebrile, will repeat CBC in AM.  - mild elevated LFTs, plan repeat in AM.  - Immunization status:  all Imms up to date, declined flu and tdap    Disposition: Does not desire Dc home today.     11/23/20, CNM 11/21/2020  2:45 PM

## 2020-11-22 LAB — COMPREHENSIVE METABOLIC PANEL
ALT: 60 U/L — ABNORMAL HIGH (ref 0–44)
AST: 41 U/L (ref 15–41)
Albumin: 2.5 g/dL — ABNORMAL LOW (ref 3.5–5.0)
Alkaline Phosphatase: 165 U/L — ABNORMAL HIGH (ref 38–126)
Anion gap: 6 (ref 5–15)
BUN: 9 mg/dL (ref 6–20)
CO2: 24 mmol/L (ref 22–32)
Calcium: 8.2 mg/dL — ABNORMAL LOW (ref 8.9–10.3)
Chloride: 108 mmol/L (ref 98–111)
Creatinine, Ser: 0.81 mg/dL (ref 0.44–1.00)
GFR, Estimated: >60 mL/min (ref >60)
Glucose, Bld: 70 mg/dL (ref 70–99)
Potassium: 3.4 mmol/L — ABNORMAL LOW (ref 3.5–5.1)
Sodium: 138 mmol/L (ref 135–145)
Total Bilirubin: 0.5 mg/dL (ref 0.3–1.2)
Total Protein: 5.6 g/dL — ABNORMAL LOW (ref 6.5–8.1)

## 2020-11-22 LAB — CBC
HCT: 31.6 % — ABNORMAL LOW (ref 36.0–46.0)
Hemoglobin: 10.7 g/dL — ABNORMAL LOW (ref 12.0–15.0)
MCH: 31.1 pg (ref 26.0–34.0)
MCHC: 33.9 g/dL (ref 30.0–36.0)
MCV: 91.9 fL (ref 80.0–100.0)
Platelets: 244 10*3/uL (ref 150–400)
RBC: 3.44 MIL/uL — ABNORMAL LOW (ref 3.87–5.11)
RDW: 12.6 % (ref 11.5–15.5)
WBC: 9.7 10*3/uL (ref 4.0–10.5)
nRBC: 0 % (ref 0.0–0.2)

## 2020-11-22 MED ORDER — SENNOSIDES-DOCUSATE SODIUM 8.6-50 MG PO TABS: 2.0000 | ORAL_TABLET | Freq: Every day | ORAL | 0 refills | Status: DC

## 2020-11-22 MED ORDER — WITCH HAZEL-GLYCERIN EX PADS: 1.0000 "application " | MEDICATED_PAD | CUTANEOUS | 12 refills | Status: DC | PRN

## 2020-11-22 MED ORDER — DIPHENHYDRAMINE HCL 25 MG PO CAPS: 25.0000 mg | ORAL_CAPSULE | Freq: Four times a day (QID) | ORAL | 0 refills | Status: DC | PRN

## 2020-11-22 MED ORDER — ACETAMINOPHEN 325 MG PO TABS: 650.0000 mg | ORAL_TABLET | ORAL | Status: DC | PRN

## 2020-11-22 MED ORDER — BENZOCAINE-MENTHOL 20-0.5 % EX AERO: 1.0000 "application " | INHALATION_SPRAY | CUTANEOUS | Status: DC | PRN

## 2020-11-22 MED ORDER — SIMETHICONE 80 MG PO CHEW: 80.0000 mg | CHEWABLE_TABLET | ORAL | 0 refills | Status: DC | PRN

## 2020-11-22 MED ORDER — IBUPROFEN 600 MG PO TABS: 600.0000 mg | ORAL_TABLET | Freq: Four times a day (QID) | ORAL | 0 refills | Status: DC

## 2020-11-22 NOTE — Progress Notes (Signed)
Patient discharged home with infant. Discharge instructions, prescriptions, and follow-up appointment were given to and reviewed with patient. Patient verbalized understanding. Escorted out by auxiliary.  

## 2021-06-07 ENCOUNTER — Telehealth: Payer: Medicaid Other | Admitting: Physician Assistant

## 2021-06-07 DIAGNOSIS — J208 Acute bronchitis due to other specified organisms: Secondary | ICD-10-CM | POA: Diagnosis not present

## 2021-06-07 MED ORDER — ALBUTEROL SULFATE HFA 108 (90 BASE) MCG/ACT IN AERS: 2.0000 | INHALATION_SPRAY | Freq: Four times a day (QID) | RESPIRATORY_TRACT | 0 refills | Status: DC | PRN

## 2021-06-07 MED ORDER — BENZONATATE 100 MG PO CAPS: 100.0000 mg | ORAL_CAPSULE | Freq: Three times a day (TID) | ORAL | 0 refills | Status: DC | PRN

## 2021-06-07 NOTE — Progress Notes (Signed)
I have spent 5 minutes in review of e-visit questionnaire, review and updating patient chart, medical decision making and response to patient.   Thierry Dobosz Cody Ashan Cueva, PA-C    

## 2021-06-07 NOTE — Progress Notes (Signed)
We are sorry that you are not feeling well.  Here is how we plan to help! ? ?Based on your presentation I believe you most likely have A cough due to a virus.  This is called viral bronchitis and is best treated by rest, plenty of fluids and control of the cough.  You may use Ibuprofen or Tylenol as directed to help your symptoms.   ?  ?In addition you may use A prescription cough medication called Tessalon Perles 100mg . You may take 1-2 capsules every 8 hours as needed for your cough. ? ?I have sent in a script for an albuterol inhaler to have on hand to use as directed, if needed, for chest tightness or wheezing.  ? ? ? ?From your responses in the eVisit questionnaire you describe inflammation in the upper respiratory tract which is causing a significant cough.  This is commonly called Bronchitis and has four common causes:   ?Allergies ?Viral Infections ?Acid Reflux ?Bacterial Infection ?Allergies, viruses and acid reflux are treated by controlling symptoms or eliminating the cause. An example might be a cough caused by taking certain blood pressure medications. You stop the cough by changing the medication. Another example might be a cough caused by acid reflux. Controlling the reflux helps control the cough. ? ?USE OF BRONCHODILATOR ("RESCUE") INHALERS: ?There is a risk from using your bronchodilator too frequently.  The risk is that over-reliance on a medication which only relaxes the muscles surrounding the breathing tubes can reduce the effectiveness of medications prescribed to reduce swelling and congestion of the tubes themselves.  Although you feel brief relief from the bronchodilator inhaler, your asthma may actually be worsening with the tubes becoming more swollen and filled with mucus.  This can delay other crucial treatments, such as oral steroid medications. If you need to use a bronchodilator inhaler daily, several times per day, you should discuss this with your provider.  There are probably  better treatments that could be used to keep your asthma under control.  ?   ?HOME CARE ?Only take medications as instructed by your medical team. ?Complete the entire course of an antibiotic. ?Drink plenty of fluids and get plenty of rest. ?Avoid close contacts especially the very young and the elderly ?Cover your mouth if you cough or cough into your sleeve. ?Always remember to wash your hands ?A steam or ultrasonic humidifier can help congestion.  ? ?GET HELP RIGHT AWAY IF: ?You develop worsening fever. ?You become short of breath ?You cough up blood. ?Your symptoms persist after you have completed your treatment plan ?MAKE SURE YOU  ?Understand these instructions. ?Will watch your condition. ?Will get help right away if you are not doing well or get worse. ?  ? ?Thank you for choosing an e-visit. ? ?Your e-visit answers were reviewed by a board certified advanced clinical practitioner to complete your personal care plan. Depending upon the condition, your plan could have included both over the counter or prescription medications. ? ?Please review your pharmacy choice. Make sure the pharmacy is open so you can pick up prescription now. If there is a problem, you may contact your provider through and have the prescription routed to another pharmacy.  Your safety is important to [employer]. If you have drug allergies check your prescription carefully.  ? ?For the next 24 hours you can use MyChart to ask questions about today's visit, request a non-urgent call back, or ask for a work or school excuse. ?You will get an  email in the next two days asking about your experience. I hope that your e-visit has been valuable and will speed your recovery. ? ?

## 2021-08-06 ENCOUNTER — Emergency Department (HOSPITAL_COMMUNITY)
Admission: EM | Admit: 2021-08-06 | Discharge: 2021-08-06 | Disposition: A | Discharge status: Home / Self Care | Payer: Medicaid Other | Attending: Emergency Medicine | Admitting: Emergency Medicine

## 2021-08-06 ENCOUNTER — Ambulatory Visit: Admission: EM | Admit: 2021-08-06 | Payer: Medicaid Other

## 2021-08-06 ENCOUNTER — Encounter (HOSPITAL_COMMUNITY): Payer: Self-pay

## 2021-08-06 ENCOUNTER — Emergency Department (HOSPITAL_COMMUNITY): Payer: Medicaid Other

## 2021-08-06 DIAGNOSIS — S0993XA Unspecified injury of face, initial encounter: Secondary | ICD-10-CM | POA: Insufficient documentation

## 2021-08-06 DIAGNOSIS — Y9389 Activity, other specified: Secondary | ICD-10-CM | POA: Diagnosis not present

## 2021-08-06 DIAGNOSIS — X58XXXA Exposure to other specified factors, initial encounter: Secondary | ICD-10-CM | POA: Diagnosis not present

## 2021-08-06 NOTE — ED Notes (Signed)
Pt up for discharge but left facility prior to being discharged by nurse. PA states he reviewed discharge information with pt prior to discharge. Pt was not in any distress or pain prior to leaving

## 2021-08-06 NOTE — Discharge Instructions (Signed)
I have given you the information for an ENT office that you may follow-up with if you like  Ibuprofen and Tylenol for pain and the swab of the swelling as well.  Ice will also help.

## 2021-08-06 NOTE — ED Provider Notes (Addendum)
[facility] EMERGENCY DEPARTMENT Provider Note   CSN: [id number] Arrival date & time: 08/06/21  1437     History  Chief Complaint  Patient presents with   Facial Injury    Destiny Mckenzie is a 21 y.o. female.  HPI  Patient is a 21 year old female with no pertinent past medical history  She is present emergency room today [place]'s [place] collided with her nose she heard a crack and states she had some pain and bruising.  She states that she still feels that there is a small lump on the side of her nose.  She states this was not present before.  She denies any occultly breathing.  No loss of consciousness nausea vomiting no headache.  She states earlier today she did have some pain in her head behind both eyes but that is resolved.  She denies any blurry vision or double vision.    Home Medications Prior to Admission medications   Medication Sig Start Date End Date Taking? Authorizing Provider  acetaminophen (TYLENOL) 325 MG tablet Take 2 tablets (650 mg total) by mouth every 4 (four) hours as needed (for pain scale < 4). 11/22/20   McVey, Prudencio Pair, CNM  albuterol (VENTOLIN HFA) 108 (90 Base) MCG/ACT inhaler Inhale 2 puffs into the lungs every 6 (six) hours as needed for wheezing or shortness of breath. 06/07/21   Waldon Merl, PA-C  benzocaine-Menthol (DERMOPLAST) 20-0.5 % AERO Apply 1 application topically as needed for irritation (perineal discomfort). 11/22/20   McVey, Prudencio Pair, CNM  benzonatate (TESSALON) 100 MG capsule Take 1 capsule (100 mg total) by mouth 3 (three) times daily as needed for cough. 06/07/21   Waldon Merl, PA-C  cetirizine (ZYRTEC) 10 MG tablet Take 10 mg by mouth daily. Patient not taking: Reported on 11/20/2020    [provider]  diphenhydrAMINE (BENADRYL) 25 mg capsule Take 1 capsule (25 mg total) by mouth every 6 (six) hours as needed for itching. 11/22/20   McVey, Prudencio Pair,  CNM  ibuprofen (ADVIL) 600 MG tablet Take 1 tablet (600 mg total) by mouth every 6 (six) hours. 11/22/20   McVey, Prudencio Pair, CNM  Prenatal Vit-Fe Fumarate-FA (PRENATAL MULTIVITAMIN) TABS tablet Take 1 tablet by mouth daily at 12 noon.    [provider]  senna-docusate (SENOKOT-S) 8.6-50 MG tablet Take 2 tablets by mouth daily. 11/22/20   McVey, Prudencio Pair, CNM  simethicone (MYLICON) 80 MG chewable tablet Chew 1 tablet (80 mg total) by mouth as needed for flatulence. 11/22/20   McVey, Prudencio Pair, CNM  witch hazel-glycerin (TUCKS) pad Apply 1 application topically as needed for hemorrhoids. 11/22/20   McVey, Prudencio Pair, CNM      Allergies    Tramadol, Fleet enema [enema], Miralax [polyethylene glycol], Bactrim [sulfamethoxazole-trimethoprim], Contrast media [iodinated contrast media], Hydroxyzine, Sodium phosphates, and Sulfa antibiotics    Review of Systems   Review of Systems  Physical Exam Updated Vital Signs BP 104/61 (BP Location: Right Arm)   Pulse 81   Temp 98.5 F (36.9 C) (Oral)   Resp 20   Ht 5\' 3"  (1.6 m)   Wt 63 kg   LMP 08/05/2021   SpO2 100%   BMI 24.62 kg/m  Physical Exam Vitals and nursing note reviewed.  Constitutional:      General: She is not in acute distress.    Appearance: Normal appearance. She is not ill-appearing.  HENT:  Head: Normocephalic.     Comments: No significant deformity to nose.  There are some tenderness on the left side of the nose just left of the bridge Eyes:     General: No scleral icterus.       Right eye: No discharge.        Left eye: No discharge.     Conjunctiva/sclera: Conjunctivae normal.  Pulmonary:     Effort: Pulmonary effort is normal.     Breath sounds: No stridor.  Neurological:     Mental Status: She is alert and oriented to person, place, and time. Mental status is at baseline.     ED Results / Procedures / Treatments   Labs (all labs ordered are listed, but only abnormal results are displayed) Labs  Reviewed - No data to display  EKG None  Radiology DG Nasal Bones  Result Date: 08/06/2021 CLINICAL DATA:  Injury with deformity. EXAM: NASAL BONES - 3+ VIEW COMPARISON:  None Available. FINDINGS: No displaced fractures are identified. IMPRESSION: Despite the reported deformity, no displaced fractures are identified. If there is continued clinical concern, CT imaging would be much more sensitive and specific. Electronically Signed   By: Gerome Sam III M.D.   On: 08/06/2021 15:27    Procedures Procedures    Medications Ordered in ED Medications - No data to display  ED Course/ Medical Decision Making/ A&P Clinical Course as of 08/06/21 2005  Sat Aug 06, 2021  1948 IMPRESSION: Despite the reported deformity, no displaced fractures are identified. If there is continued clinical concern, CT imaging would be much more sensitive and specific.   Electronically Signed   By: Gerome Sam III M.D.   On: 08/06/2021 15:27   [WF]    Clinical Course User Index [WF] Gailen Shelter, PA                           Medical Decision Making  Patient is a 21 year old female with no pertinent past medical history  She is present emergency room today 2 weeks [place] the [place] of someone's head collided with her nose she heard a crack and states she had some pain and bruising.  She states that she still feels that there is a small lump on the side of her nose.  She states this was not present before.  She denies any occultly breathing.  No loss of consciousness nausea vomiting no headache.  She states earlier today she did have some pain in her head behind both eyes but that is resolved.  She denies any blurry vision or double vision.  I personally viewed x-rays of facial bones which were unremarkable.  He is of a relatively low sensitivity however I am somewhat reassured by the lack of findings.  No septal hematomas.  Patient is overall well-appearing  EOMI.  No evidence of entrapment.  Offered CT imaging versus conservative management.  We had a lengthy shared decision conversation about this.  She would prefer the latter option.  We will discharge her home with ENT follow-up.  Recommend Tylenol ibuprofen.    Final Clinical Impression(s) / ED Diagnoses Final diagnoses:  Facial injury, initial encounter    Rx / DC Orders ED Discharge Orders     None         Gailen Shelter, [place] 08/06/21 2007    Solon Augusta Melissa, [place] 08/06/21 2008    Vanetta Mulders,  MD 08/12/21 1647

## 2021-08-06 NOTE — ED Triage Notes (Signed)
Pt [place]. Pt heard a "crack" and initally had pain. Yesterday the pain was aggravated when she bumped it again.  Pt has noted deformity.

## 2021-11-09 IMAGING — US US OB LIMITED
1 series · 6 of 6 positions shown · non-contrast
Comparison: none

CLINICAL DATA: Preterm labor at 32.5 weeks.

EXAM:
LIMITED OBSTETRIC ULTRASOUND AND TRANSVAGINAL OBSTETRIC ULTRASOUND

[Series 1001: ob us · 6 of 6 slices shown]
[im 1/6]
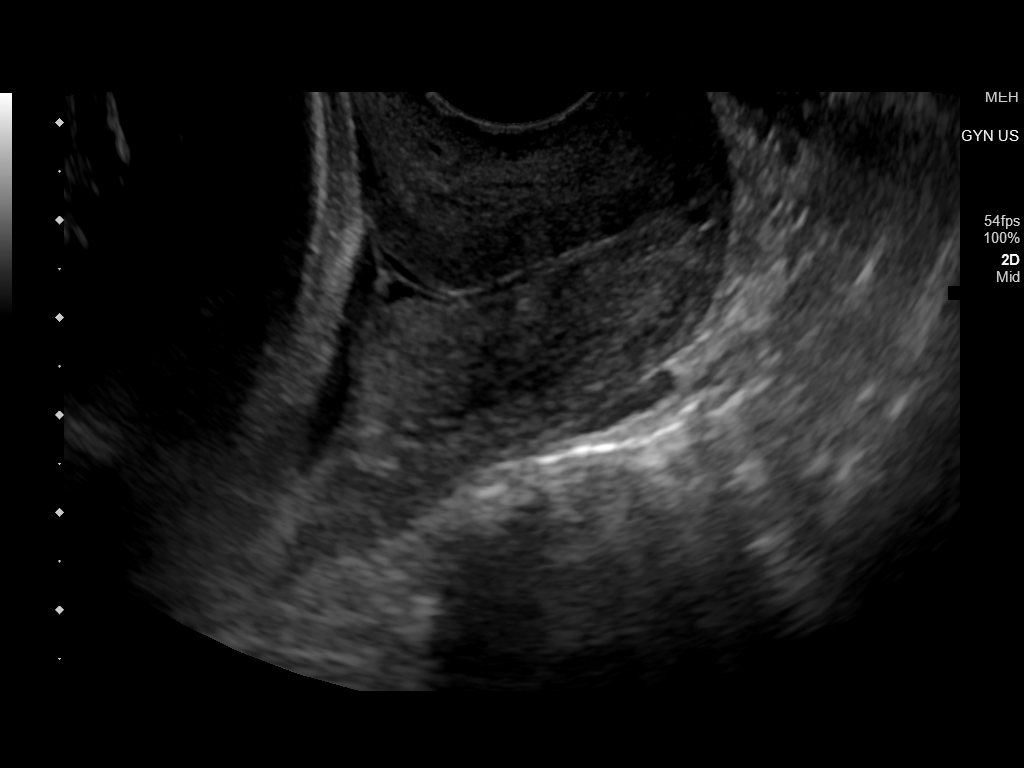
[im 2/6]
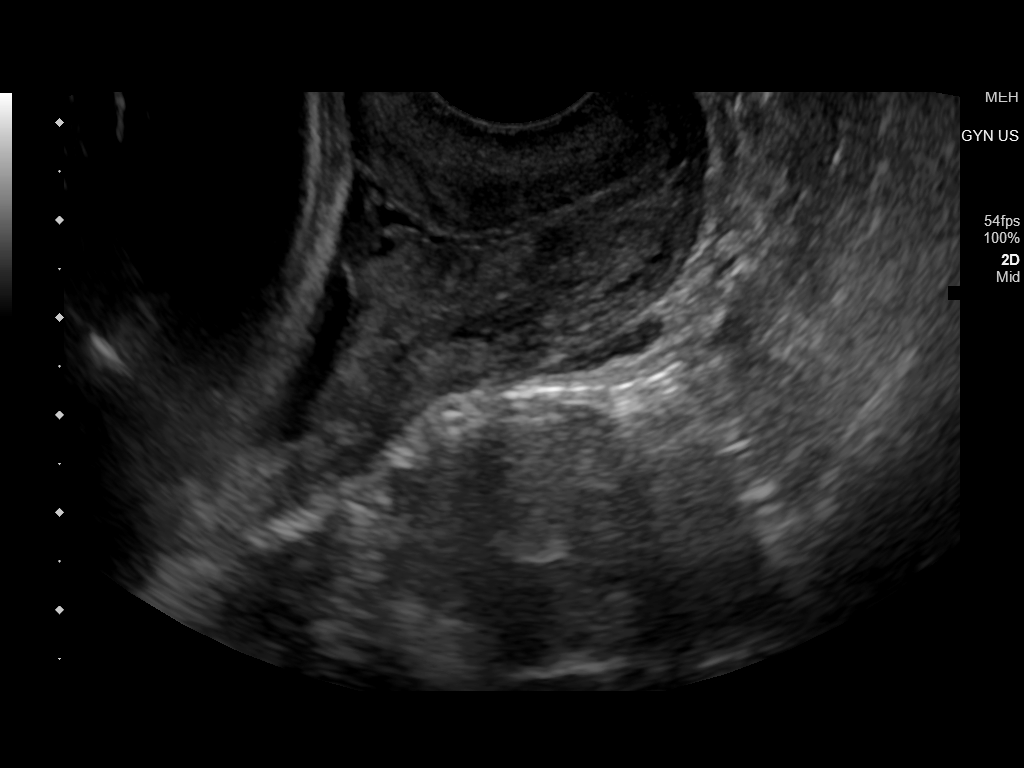
[im 3/6]
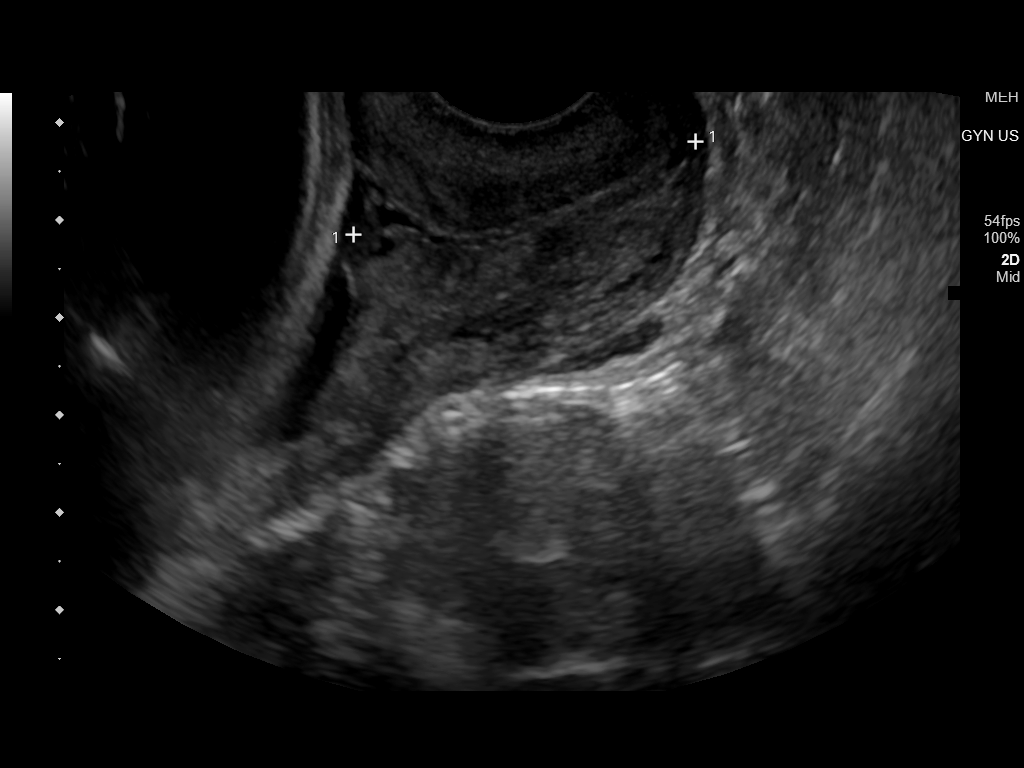
[im 4/6]
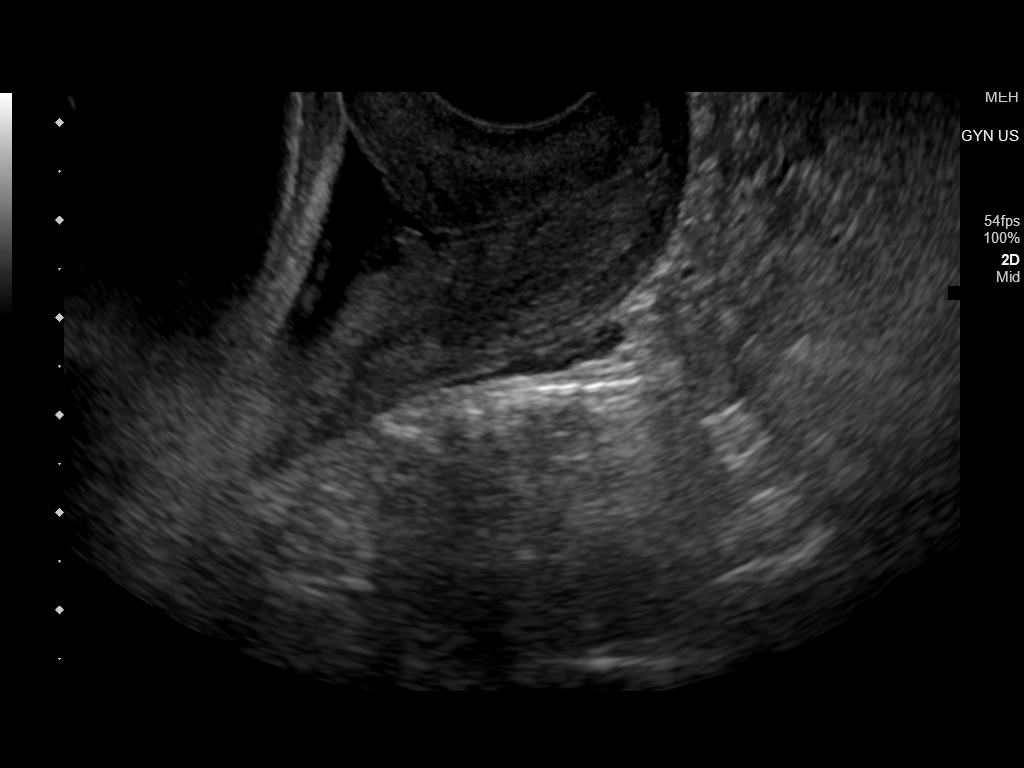
[im 5/6]
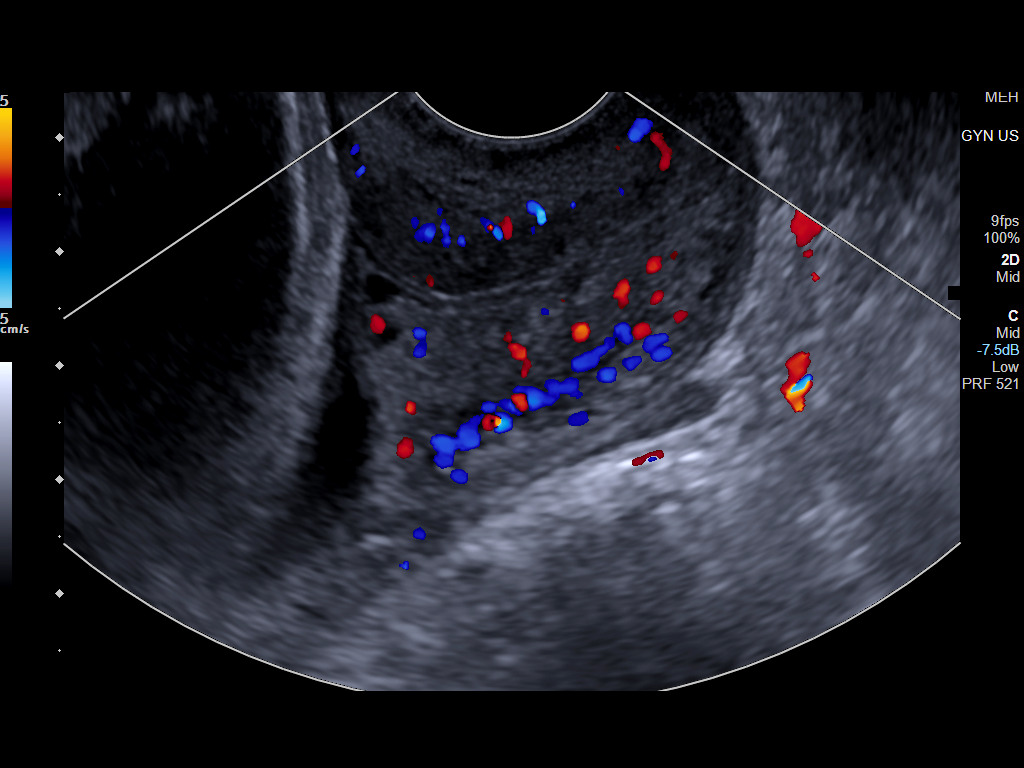
[im 6/6]
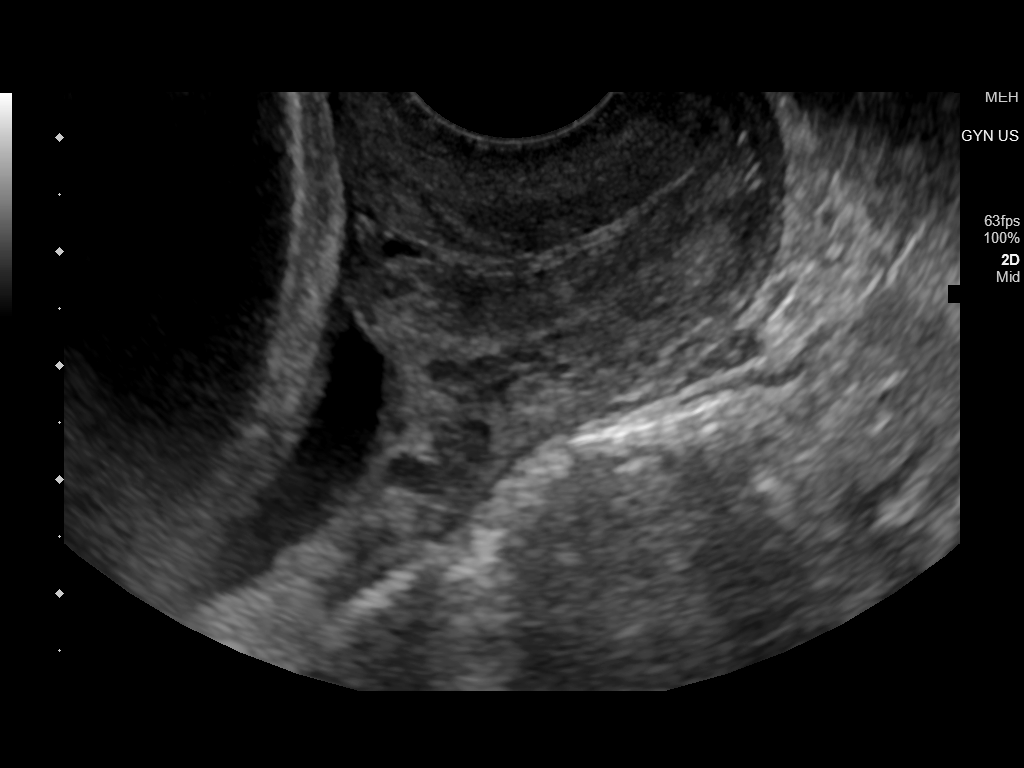

[6 of 6 positions shown; findings below may reference images not displayed]

FINDINGS: Number of Fetuses: 1

Heart Rate:  129 bpm

Movement: Yes

Presentation: Cephalic

Previa: No

Placental Location: Anterior

Amniotic Fluid (Subjective): Within normal limits

AFI 11.5 cm

BPD:  8.7cm 35w 0d

Maternal Findings:

Cervix:  3.6 cm TV

Uterus/Adnexae: No abnormality visualized.
IMPRESSION: Single living intrauterine fetus in cephalic presentation.

Normal cervical length.  No evidence of placenta previa.

## 2022-08-29 ENCOUNTER — Other Ambulatory Visit: Payer: Self-pay

## 2022-08-29 ENCOUNTER — Encounter (HOSPITAL_COMMUNITY): Payer: Self-pay | Admitting: Emergency Medicine

## 2022-08-29 ENCOUNTER — Emergency Department (HOSPITAL_COMMUNITY): Payer: Medicaid Other

## 2022-08-29 ENCOUNTER — Emergency Department (HOSPITAL_COMMUNITY)
Admission: EM | Admit: 2022-08-29 | Discharge: 2022-08-29 | Disposition: A | Discharge status: Home / Self Care | Payer: Medicaid Other | Attending: Emergency Medicine | Admitting: Emergency Medicine

## 2022-08-29 DIAGNOSIS — O99891 Other specified diseases and conditions complicating pregnancy: Secondary | ICD-10-CM | POA: Insufficient documentation

## 2022-08-29 DIAGNOSIS — R42 Dizziness and giddiness: Secondary | ICD-10-CM | POA: Diagnosis not present

## 2022-08-29 DIAGNOSIS — Z3491 Encounter for supervision of normal pregnancy, unspecified, first trimester: Secondary | ICD-10-CM

## 2022-08-29 DIAGNOSIS — O0001 Abdominal pregnancy with intrauterine pregnancy: Secondary | ICD-10-CM | POA: Insufficient documentation

## 2022-08-29 LAB — CBC
HCT: 38.6 % (ref 36.0–46.0)
Hemoglobin: 12.8 g/dL (ref 12.0–15.0)
MCH: 30.7 pg (ref 26.0–34.0)
MCHC: 33.2 g/dL (ref 30.0–36.0)
MCV: 92.6 fL (ref 80.0–100.0)
Platelets: 460 10*3/uL — ABNORMAL HIGH (ref 150–400)
RBC: 4.17 MIL/uL (ref 3.87–5.11)
RDW: 12.1 % (ref 11.5–15.5)
WBC: 12.7 10*3/uL — ABNORMAL HIGH (ref 4.0–10.5)
nRBC: 0 % (ref 0.0–0.2)

## 2022-08-29 LAB — HCG, QUANTITATIVE, PREGNANCY: hCG, Beta Chain, Quant, S: 27487 m[IU]/mL — ABNORMAL HIGH (ref <5)

## 2022-08-29 LAB — URINALYSIS, ROUTINE W REFLEX MICROSCOPIC
Bacteria, UA: NONE SEEN
Bilirubin Urine: NEGATIVE
Glucose, UA: NEGATIVE mg/dL
Ketones, ur: NEGATIVE mg/dL
Leukocytes,Ua: NEGATIVE
Nitrite: NEGATIVE
Protein, ur: NEGATIVE mg/dL
Specific Gravity, Urine: 1.009 (ref 1.005–1.030)
pH: 7 (ref 5.0–8.0)

## 2022-08-29 LAB — COMPREHENSIVE METABOLIC PANEL
ALT: 28 U/L (ref 0–44)
AST: 20 U/L (ref 15–41)
Albumin: 4.3 g/dL (ref 3.5–5.0)
Alkaline Phosphatase: 58 U/L (ref 38–126)
Anion gap: 11 (ref 5–15)
BUN: 10 mg/dL (ref 6–20)
CO2: 23 mmol/L (ref 22–32)
Calcium: 9.5 mg/dL (ref 8.9–10.3)
Chloride: 103 mmol/L (ref 98–111)
Creatinine, Ser: 0.68 mg/dL (ref 0.44–1.00)
GFR, Estimated: >60 mL/min (ref >60)
Glucose, Bld: 76 mg/dL (ref 70–99)
Potassium: 3.8 mmol/L (ref 3.5–5.1)
Sodium: 137 mmol/L (ref 135–145)
Total Bilirubin: 0.7 mg/dL (ref 0.3–1.2)
Total Protein: 7.4 g/dL (ref 6.5–8.1)

## 2022-08-29 LAB — CBG MONITORING, ED: Glucose-Capillary: 96 mg/dL (ref 70–99)

## 2022-08-29 MED ORDER — LACTATED RINGERS IV BOLUS
1000.0000 mL | Freq: Once | INTRAVENOUS | Status: AC
  Administered 2022-08-29: 1000 mL via INTRAVENOUS

## 2022-08-29 NOTE — ED Provider Notes (Signed)
[place] EMERGENCY DEPARTMENT AT [facility] Provider Note   CSN: [id number] Arrival date & time: 08/29/22  1503     History  Chief Complaint  Patient presents with   Near Syncope    Destiny Mckenzie is a 22 y.o. female.  HPI    22 year old G2, P7 female comes with a chief complaint of dizziness and near fainting.  Patient states that over the last 2 weeks has been having episodes of dizziness.  Dizziness are present when she gets up but also now, more recently present when she is sitting at work on the computer.  She constantly feels " fuzzy headed" and when she is trying to move she will get dizzy with episodes where she might even faint.  Patient has been eating and drinking well.  She did not have similar complications with her first pregnancy.  Patient denies any history of DVT, PE.  She has no chest pain, shortness of breath.  She also denies any abdominal pain, back pain, vaginal bleeding no UTI-like symptoms.  She had gone to [facility] for her OB visit.  They had recommended that if her symptoms get worse, then she needs to come to the ER to rule out ectopic pregnancy, which is why patient is here.  Home Medications Prior to Admission medications   Medication Sig Start Date End Date Taking? Authorizing Provider  acetaminophen (TYLENOL) 325 MG tablet Take 2 tablets (650 mg total) by mouth every 4 (four) hours as needed (for pain scale < 4). 11/22/20   McVey, Prudencio Pair, CNM  albuterol (VENTOLIN HFA) 108 (90 Base) MCG/ACT inhaler Inhale 2 puffs into the lungs every 6 (six) hours as needed for wheezing or shortness of breath. 06/07/21   Waldon Merl, PA-C  benzocaine-Menthol (DERMOPLAST) 20-0.5 % AERO Apply 1 application topically as needed for irritation (perineal discomfort). 11/22/20   McVey, Prudencio Pair, CNM  benzonatate (TESSALON) 100 MG capsule Take 1 capsule (100 mg total) by mouth 3 (three) times daily as needed for cough. 06/07/21   Waldon Merl,  PA-C  cetirizine (ZYRTEC) 10 MG tablet Take 10 mg by mouth daily. Patient not taking: Reported on 11/20/2020    [provider]  diphenhydrAMINE (BENADRYL) 25 mg capsule Take 1 capsule (25 mg total) by mouth every 6 (six) hours as needed for itching. 11/22/20   McVey, Prudencio Pair, CNM  ibuprofen (ADVIL) 600 MG tablet Take 1 tablet (600 mg total) by mouth every 6 (six) hours. 11/22/20   McVey, Prudencio Pair, CNM  Prenatal Vit-Fe Fumarate-FA (PRENATAL MULTIVITAMIN) TABS tablet Take 1 tablet by mouth daily at 12 noon.    [provider]  senna-docusate (SENOKOT-S) 8.6-50 MG tablet Take 2 tablets by mouth daily. 11/22/20   McVey, Prudencio Pair, CNM  simethicone (MYLICON) 80 MG chewable tablet Chew 1 tablet (80 mg total) by mouth as needed for flatulence. 11/22/20   McVey, Prudencio Pair, CNM  witch hazel-glycerin (TUCKS) pad Apply 1 application topically as needed for hemorrhoids. 11/22/20   McVey, Prudencio Pair, CNM      Allergies    Tramadol, Fleet enema [enema], Miralax [polyethylene glycol], Bactrim [sulfamethoxazole-trimethoprim], Contrast media [iodinated contrast media], Hydroxyzine, Sodium phosphates, and Sulfa antibiotics    Review of Systems   Review of Systems  All other systems reviewed and are negative.   Physical Exam Updated Vital Signs BP 106/71 (BP Location: Left Arm)   Pulse 88   Temp 99.4 F (37.4 C) (Oral)   Resp 16  Ht 5\' 3"  (1.6 m)   Wt 53.5 kg   SpO2 100%   BMI 20.90 kg/m  Physical Exam Vitals and nursing note reviewed.  Constitutional:      Appearance: She is well-developed.  HENT:     Head: Atraumatic.  Eyes:     Extraocular Movements: Extraocular movements intact.     Pupils: Pupils are equal, round, and reactive to light.  Cardiovascular:     Rate and Rhythm: Normal rate.  Pulmonary:     Effort: Pulmonary effort is normal.  Musculoskeletal:     Cervical back: Normal range of motion and neck supple.  Skin:    General: Skin is warm and dry.   Neurological:     Mental Status: She is alert and oriented to person, place, and time.     ED Results / Procedures / Treatments   Labs (all labs ordered are listed, but only abnormal results are displayed) Labs Reviewed  CBC - Abnormal; Notable for the following components:      Result Value   WBC 12.7 (*)    Platelets 460 (*)    All other components within normal limits  URINALYSIS, ROUTINE W REFLEX MICROSCOPIC - Abnormal; Notable for the following components:   Color, Urine STRAW (*)    Hgb urine dipstick SMALL (*)    All other components within normal limits  HCG, QUANTITATIVE, PREGNANCY - Abnormal; Notable for the following components:   hCG, Beta Chain, [place] Longest 27,487 (*)    All other components within normal limits  COMPREHENSIVE METABOLIC PANEL  CBG MONITORING, ED    EKG EKG Interpretation Date/Time:  Tuesday August 29 2022 15:28:47 EDT Ventricular Rate:  80 PR Interval:  136 QRS Duration:  80 QT Interval:  356 QTC Calculation: 410 R Axis:   86  Text Interpretation: Normal sinus rhythm with sinus arrhythmia Normal ECG When compared with ECG of 30-Dec-2012 21:02, PREVIOUS ECG IS PRESENT No acute changes No significant change since last tracing Confirmed by Derwood Kaplan [phone]) on 08/29/2022 3:36:35 PM  Radiology US OB LESS THAN 14 WEEKS WITH OB TRANSVAGINAL  Result Date: 08/29/2022 CLINICAL DATA:  Dizziness and headache for 5 days.  Pregnant EXAM: OBSTETRIC <14 WK [place] AND TRANSVAGINAL OB US TECHNIQUE: Both transabdominal and transvaginal ultrasound examinations were performed for complete evaluation of the gestation as well as the maternal uterus, adnexal regions, and pelvic cul-de-sac. Transvaginal technique was performed to assess early pregnancy. COMPARISON:  Ultrasound 10/19/2020 FINDINGS: Intrauterine gestational sac: Single Yolk sac:  Visualized. Embryo:  Visualized. Cardiac Activity: Visualized. Heart Rate: 155 bpm CRL:  4.7 mm   6 w   1 d                  [place]  EDC: 03/26/2023 Subchorionic hemorrhage:  None visualized. Maternal uterus/adnexae: Associated with the well the ovary is a complex cystic area measuring 3.4 x 3.4 x 3.3 cm with a thin septation. Preserved blood flow to the associated ovary. The left ovary overall measures 5.2 x 3.5 by 3.9 cm and was measured best transabdominally. The right ovary has small follicles and measures 2.6 x 1.4 by 1.9 cm. Trace free fluid in the pelvis. Prominent periuterine vessels. IMPRESSION: Single live intrauterine pregnancy of 6 weeks and 1 day by crown-rump length. Positive fetal heart motion. Trace free fluid in the pelvis. 3.4 cm mildly complex left-sided ovarian cystic lesion with some septations. Simple attention on follow-up. Electronically Signed   By: Piedad Climes.D.  On: 08/29/2022 17:10    Procedures Procedures    Medications Ordered in ED Medications  lactated ringers bolus 1,000 mL (0 mLs Intravenous Stopped 08/29/22 1653)    ED Course/ Medical Decision Making/ A&P                                 Medical Decision Making Amount and/or Complexity of Data Reviewed Labs: ordered. Radiology: ordered.  22 year old G2, P31 female was in first trimester comes in with chief complaint of near fainting and dizziness.  Differential diagnosis considered for her includes physiologic venodilation leading to dizziness, orthostasis, dehydration, severe anemia.  She has no pain, no bleeding therefore I doubt that this is ectopic pregnancy.  However patient was advised to come to the ER specifically to rule out ectopic if her symptoms worsen, so she is really hopeful that we can rule out ectopic pregnancy.  I ordered orthostatics on the patient and she was dizzy and her heart rate jumped by 20+ points.  Will give her IV fluids.  Basic labs have been ordered.  Ultrasound to rule out ectopic ordered.   7:05 PM Patient's labs reveal no evidence of profound anemia, dehydration or electrolyte abnormality.   Ultrasound also is negative for ectopic pregnancy.  Results discussed with the patient.  She is comfortable with discharge.  Final Clinical Impression(s) / ED Diagnoses Final diagnoses:  Orthostatic dizziness  Normal IUP (intrauterine pregnancy) on prenatal ultrasound, first trimester    Rx / DC Orders ED Discharge Orders     None         Derwood Kaplan, MD 08/29/22 1906

## 2022-08-29 NOTE — ED Notes (Signed)
   08/29/22 1500  Orthostatic Lying   BP- Lying 104/65  Pulse- Lying 85  Orthostatic Sitting  BP- Sitting 112/72  Pulse- Sitting 92  Orthostatic Standing at 0 minutes  BP- Standing at 0 minutes 105/74  Pulse- Standing at 0 minutes 100

## 2022-08-29 NOTE — ED Triage Notes (Signed)
Pt presents with lightheaded and slight headache, since last Thursday, currently [redacted] weeks pregnant.

## 2022-08-29 NOTE — ED Notes (Signed)
Introduced self to pt Pt stated that she is light headed and dizzy when standing. Pt is pregnant due 06/17/23  Denies pain

## 2022-08-29 NOTE — Discharge Instructions (Addendum)
Your ultrasound reveals that you are 6 weeks and 1 day pregnant per ultrasound.  Labs are otherwise reassuring, no evidence of profound dehydration.  Your hemoglobin is fine.  You do not have ectopic pregnancy.  We suspect that your dizziness is likely result of you being pregnant and having some hormonal and physiologic changes from it.  We recommend that you hydrate well. Be careful with ambulation.  Return to the emergency room if your symptoms get worse.

## 2022-09-11 ENCOUNTER — Ambulatory Visit (INDEPENDENT_AMBULATORY_CARE_PROVIDER_SITE_OTHER): Payer: Medicaid Other

## 2022-09-11 VITALS — Wt 118.0 lb

## 2022-09-11 DIAGNOSIS — Z3689 Encounter for other specified antenatal screening: Secondary | ICD-10-CM

## 2022-09-11 DIAGNOSIS — Z3480 Encounter for supervision of other normal pregnancy, unspecified trimester: Secondary | ICD-10-CM | POA: Insufficient documentation

## 2022-09-11 DIAGNOSIS — Z369 Encounter for antenatal screening, unspecified: Secondary | ICD-10-CM

## 2022-09-11 NOTE — Patient Instructions (Signed)
First Trimester of Pregnancy  The first trimester of pregnancy starts on the first day of your last menstrual period until the end of week 12. This is also called months 1 through 3 of pregnancy. Body changes during your first trimester Your body goes through many changes during pregnancy. The changes usually return to normal after your baby is born. Physical changes You may gain or lose weight. Your breasts may grow larger and hurt. The area around your nipples may get darker. Dark spots or blotches may develop on your face. You may have changes in your hair. Health changes You may feel like you might vomit (nauseous), and you may vomit. You may have heartburn. You may have headaches. You may have trouble pooping (constipation). Your gums may bleed. Other changes You may get tired easily. You may pee (urinate) more often. Your menstrual periods will stop. You may not feel hungry. You may want to eat certain kinds of food. You may have changes in your emotions from day to day. You may have more dreams. Follow these instructions at home: Medicines Take over-the-counter and prescription medicines only as told by your doctor. Some medicines are not safe during pregnancy. Take a prenatal vitamin that contains at least 600 micrograms (mcg) of folic acid. Eating and drinking Eat healthy meals that include: Fresh fruits and vegetables. Whole grains. Good sources of protein, such as meat, eggs, or tofu. Low-fat dairy products. Avoid raw meat and unpasteurized juice, milk, and cheese. If you feel like you may vomit, or you vomit: Eat 4 or 5 small meals a day instead of 3 large meals. Try eating a few soda crackers. Drink liquids between meals instead of during meals. You may need to take these actions to prevent or treat trouble pooping: Drink enough fluids to keep your pee (urine) pale yellow. Eat foods that are high in fiber. These include beans, whole grains, and fresh fruits and  vegetables. Limit foods that are high in fat and sugar. These include fried or sweet foods. Activity Exercise only as told by your doctor. Most people can do their usual exercise routine during pregnancy. Stop exercising if you have cramps or pain in your lower belly (abdomen) or low back. Do not exercise if it is too hot or too humid, or if you are in a place of great height (high altitude). Avoid heavy lifting. If you choose to, you may have sex unless your doctor tells you not to. Relieving pain and discomfort Wear a good support bra if your breasts are sore. Rest with your legs raised (elevated) if you have leg cramps or low back pain. If you have bulging veins (varicose veins) in your legs: Wear support hose as told by your doctor. Raise your feet for 15 minutes, 3-4 times a day. Limit salt in your food. Safety Wear your seat belt at all times when you are in a car. Talk with your doctor if someone is hurting you or yelling at you. Talk with your doctor if you are feeling sad or have thoughts of hurting yourself. Lifestyle Do not use hot tubs, steam rooms, or saunas. Do not douche. Do not use tampons or scented sanitary pads. Do not use herbal medicines, illegal drugs, or medicines that are not approved by your doctor. Do not drink alcohol. Do not smoke or use any products that contain nicotine or tobacco. If you need help quitting, ask your doctor. Avoid cat litter boxes and soil that is used by cats. These carry   germs that can cause harm to the baby and can cause a loss of your baby by miscarriage or stillbirth. General instructions Keep all follow-up visits. This is important. Ask for help if you need counseling or if you need help with nutrition. Your doctor can give you advice or tell you where to go for help. Visit your dentist. At home, brush your teeth with a soft toothbrush. Floss gently. Write down your questions. Take them to your prenatal visits. Where to find more  information American Pregnancy Association: americanpregnancy.org American College of Obstetricians and Gynecologists: www.acog.org Office on Women's Health: womenshealth.gov/pregnancy Contact a doctor if: You are dizzy. You have a fever. You have mild cramps or pressure in your lower belly. You have a nagging pain in your belly area. You continue to feel like you may vomit, you vomit, or you have watery poop (diarrhea) for 24 hours or longer. You have a bad-smelling fluid coming from your vagina. You have pain when you pee. You are exposed to a disease that spreads from person to person, such as chickenpox, measles, Zika virus, HIV, or hepatitis. Get help right away if: You have spotting or bleeding from your vagina. You have very bad belly cramping or pain. You have shortness of breath or chest pain. You have any kind of injury, such as from a fall or a car crash. You have new or increased pain, swelling, or redness in an arm or leg. Summary The first trimester of pregnancy starts on the first day of your last menstrual period until the end of week 12 (months 1 through 3). Eat 4 or 5 small meals a day instead of 3 large meals. Do not smoke or use any products that contain nicotine or tobacco. If you need help quitting, ask your doctor. Keep all follow-up visits. This information is not intended to replace advice given to you by your health care provider. Make sure you discuss any questions you have with your health care provider. Document Revised: 06/18/2019 Document Reviewed: 04/24/2019 Elsevier Patient Education  2024 [employer] Commonly Asked Questions During Pregnancy  Cats: A parasite can be excreted in cat feces.  To avoid exposure you need to have another person empty the little box.  If you must empty the litter box you will need to wear gloves.  Wash your hands after handling your cat.  This parasite can also be found in raw or undercooked meat so this should also be  avoided.  Colds, Sore Throats, Flu: Please check your medication sheet to see what you can take for symptoms.  If your symptoms are unrelieved by these medications please call the office.  Dental Work: Most any dental work your dentist recommends is permitted.  X-rays should only be taken during the first trimester if absolutely necessary.  Your abdomen should be shielded with a lead apron during all x-rays.  Please notify your provider prior to receiving any x-rays.  Novocaine is fine; gas is not recommended.  If your dentist requires a note from us prior to dental work please call the office and we will provide one for you.  Exercise: Exercise is an important part of staying healthy during your pregnancy.  You may continue most exercises you were accustomed to prior to pregnancy.  Later in your pregnancy you will most likely notice you have difficulty with activities requiring balance like riding a bicycle.  It is important that you listen to your body and avoid activities that put you at a higher   risk of falling.  Adequate rest and staying well hydrated are a must!  If you have questions about the safety of specific activities ask your provider.    Exposure to Children with illness: Try to avoid obvious exposure; report any symptoms to us when noted,  If you have chicken pos, red measles or mumps, you should be immune to these diseases.   Please do not take any vaccines while pregnant unless you have checked with your OB provider.  Fetal Movement: After 28 weeks we recommend you do "kick counts" twice daily.  Lie or sit down in a calm quiet environment and count your baby movements "kicks".  You should feel your baby at least 10 times per hour.  If you have not felt 10 kicks within the first hour get up, walk around and have something sweet to eat or drink then repeat for an additional hour.  If count remains less than 10 per hour notify your provider.  Fumigating: Follow your pest control agent's  advice as to how long to stay out of your home.  Ventilate the area well before re-entering.  Hemorrhoids:   Most over-the-counter preparations can be used during pregnancy.  Check your medication to see what is safe to use.  It is important to use a stool softener or fiber in your diet and to drink lots of liquids.  If hemorrhoids seem to be getting worse please call the office.   Hot Tubs:  Hot tubs Jacuzzis and saunas are not recommended while pregnant.  These increase your internal body temperature and should be avoided.  Intercourse:  Sexual intercourse is safe during pregnancy as long as you are comfortable, unless otherwise advised by your provider.  Spotting may occur after intercourse; report any bright red bleeding that is heavier than spotting.  Labor:  If you know that you are in labor, please go to the hospital.  If you are unsure, please call the office and let us help you decide what to do.  Lifting, straining, etc:  If your job requires heavy lifting or straining please check with your provider for any limitations.  Generally, you should not lift items heavier than that you can lift simply with your hands and arms (no back muscles)  Painting:  Paint fumes do not harm your pregnancy, but may make you ill and should be avoided if possible.  Latex or water based paints have less odor than oils.  Use adequate ventilation while painting.  Permanents & Hair Color:  Chemicals in hair dyes are not recommended as they cause increase hair dryness which can increase hair loss during pregnancy.  " Highlighting" and permanents are allowed.  Dye may be absorbed differently and permanents may not hold as well during pregnancy.  Sunbathing:  Use a sunscreen, as skin burns easily during pregnancy.  Drink plenty of fluids; avoid over heating.  Tanning Beds:  Because their possible side effects are still unknown, tanning beds are not recommended.  Ultrasound Scans:  Routine ultrasounds are performed  at approximately 20 weeks.  You will be able to see your baby's general anatomy an if you would like to know the gender this can usually be determined as well.  If it is questionable when you conceived you may also receive an ultrasound early in your pregnancy for dating purposes.  Otherwise ultrasound exams are not routinely performed unless there is a medical necessity.  Although you can request a scan we ask that you pay for it when   conducted because insurance does not cover " patient request" scans.  Work: If your pregnancy proceeds without complications you may work until your due date, unless your physician or employer advises otherwise.  Round Ligament Pain/Pelvic Discomfort:  Sharp, shooting pains not associated with bleeding are fairly common, usually occurring in the second trimester of pregnancy.  They tend to be worse when standing up or when you remain standing for long periods of time.  These are the result of pressure of certain pelvic ligaments called "round ligaments".  Rest, Tylenol and heat seem to be the most effective relief.  As the womb and fetus grow, they rise out of the pelvis and the discomfort improves.  Please notify the office if your pain seems different than that described.  It may represent a more serious condition.  Common Medications Safe in Pregnancy  Acne:      Constipation:  Benzoyl Peroxide     Colace  Clindamycin      Dulcolax Suppository  Topica Erythromycin     Fibercon  Salicylic Acid      Metamucil         Miralax AVOID:        Senakot   Accutane    Cough:  Retin-A       Cough Drops  Tetracycline      Phenergan w/ Codeine if Rx  Minocycline      Robitussin (Plain & DM)  Antibiotics:     Crabs/Lice:  Ceclor       RID  Cephalosporins    AVOID:  E-Mycins      Kwell  Keflex  Macrobid/Macrodantin   Diarrhea:  Penicillin      Kao-Pectate  Zithromax      Imodium AD         PUSH FLUIDS AVOID:       Cipro     Fever:  Tetracycline      Tylenol (Regular  or Extra  Minocycline       Strength)  Levaquin      Extra Strength-Do not          Exceed 8 tabs/24 hrs Caffeine:        <200mg/day (equiv. To 1 cup of coffee or  approx. 3 12 oz sodas)         Gas: Cold/Hayfever:       Gas-X  Benadryl      Mylicon  Claritin       Phazyme  **Claritin-D        Chlor-Trimeton    Headaches:  Dimetapp      ASA-Free Excedrin  Drixoral-Non-Drowsy     Cold Compress  Mucinex (Guaifenasin)     Tylenol (Regular or Extra  Sudafed/Sudafed-12 Hour     Strength)  **Sudafed PE Pseudoephedrine   Tylenol Cold & Sinus     Vicks Vapor Rub  Zyrtec  **AVOID if Problems With Blood Pressure         Heartburn: Avoid lying down for at least 1 hour after meals  Aciphex      Maalox     Rash:  Milk of Magnesia     Benadryl    Mylanta       1% Hydrocortisone Cream  Pepcid  Pepcid Complete   Sleep Aids:  Prevacid      Ambien   Prilosec       Benadryl  Rolaids       Chamomile Tea  Tums (Limit 4/day)     Unisom           Tylenol PM         Warm milk-add vanilla or  Hemorrhoids:       Sugar for taste  Anusol/Anusol H.C.  (RX: Analapram 2.5%)  Sugar Substitutes:  Hydrocortisone OTC     Ok in moderation  Preparation H      Tucks        Vaseline lotion applied to tissue with wiping    Herpes:     Throat:  Acyclovir      Oragel  Famvir  Valtrex     Vaccines:         Flu Shot Leg Cramps:       *Gardasil  Benadryl      Hepatitis A         Hepatitis B Nasal Spray:       Pneumovax  Saline Nasal Spray     Polio Booster         Tetanus Nausea:       Tuberculosis test or PPD  Vitamin B6 25 mg TID   AVOID:    Dramamine      *Gardasil  Emetrol       Live Poliovirus  Ginger Root 250 mg QID    MMR (measles, mumps &  High Complex Carbs @ Bedtime    rebella)  Sea Bands-Accupressure    Varicella (Chickenpox)  Unisom 1/2 tab TID     *No known complications           If received before Pain:         Known pregnancy;   Darvocet       Resume series  after  Lortab        Delivery  Percocet    Yeast:   Tramadol      Femstat  Tylenol 3      Gyne-lotrimin  Ultram       Monistat  Vicodin           MISC:         All Sunscreens           Hair Coloring/highlights          Insect Repellant's          (Including DEET)         Mystic Tans  

## 2022-09-11 NOTE — Progress Notes (Signed)
New OB Intake  I connected with  Destiny Mckenzie on 09/11/22 at 11:15 AM EDT by telephone Video Visit and verified that I am speaking with the correct person using two identifiers. Nurse is located at [facility] and pt is located at work.  I explained I am completing New OB Intake today. We discussed her EDD of 04/23/2023 that is based on u/s of [redacted]w[redacted]d. Pt is G2/P1001. I reviewed her allergies, medications, Medical/Surgical/OB history, and appropriate screenings. There are no cats in the home.  Based on history, this is a/an pregnancy uncomplicated .   Patient Active Problem List   Diagnosis Date Noted   Supervision of other normal pregnancy, antepartum 09/11/2022   Cholestasis of pregnancy in third trimester 11/20/2020   Severe itching 11/06/2020   Uterine contractions 10/19/2020   Decreased fetal movement 10/04/2020   Encounter for supervision of normal pregnancy 04/21/2020   Low back pain 03/21/2019   Lumbar radiculopathy 02/18/2019    Concerns addressed today Acid reflux; adv prilosec, will send safe meds list; elevate HOB 4-6 inches, lie on her right side as stomach empties out on the right; avoid spicy, greasy, highly acidic food or foods with a strong odor.  Constipation - adv per new protocol.  Delivery Plans:  Plans to deliver at [facility].  Anatomy US Explained first scheduled [place] was done 08/29/22; another u/s is scheduled for 8/26th and an anatomy scan will be done at 20 weeks.  Labs Discussed genetic screening with patient. Patient desires genetic testing to be drawn at new OB visit. Discussed possible labs to be drawn at new OB appointment.  COVID Vaccine Patient has not had COVID vaccine.   Social Determinants of Health Food Insecurity: denies food insecurity  Transportation: Patient denies transportation needs. Childcare: Discussed no children allowed at ultrasound appointments.   First visit review I reviewed new OB appt with pt. I explained  she will have ob bloodwork and pap smear/pelvic exam if indicated. Explained pt will be seen by an AOB provider at first visit; encounter routed to appropriate provider.   Loran Senters, [facility] 09/11/2022  11:44 AM

## 2022-09-18 ENCOUNTER — Ambulatory Visit (INDEPENDENT_AMBULATORY_CARE_PROVIDER_SITE_OTHER): Payer: Medicaid Other

## 2022-09-18 ENCOUNTER — Other Ambulatory Visit: Payer: Self-pay

## 2022-09-18 DIAGNOSIS — Z3A01 Less than 8 weeks gestation of pregnancy: Secondary | ICD-10-CM

## 2022-09-18 DIAGNOSIS — Z3687 Encounter for antenatal screening for uncertain dates: Secondary | ICD-10-CM

## 2022-09-18 DIAGNOSIS — Z369 Encounter for antenatal screening, unspecified: Secondary | ICD-10-CM

## 2022-09-18 DIAGNOSIS — Z3480 Encounter for supervision of other normal pregnancy, unspecified trimester: Secondary | ICD-10-CM

## 2022-10-06 DIAGNOSIS — N83202 Unspecified ovarian cyst, left side: Secondary | ICD-10-CM

## 2022-10-06 HISTORY — DX: Unspecified ovarian cyst, left side: N83.202

## 2022-10-09 ENCOUNTER — Ambulatory Visit (INDEPENDENT_AMBULATORY_CARE_PROVIDER_SITE_OTHER): Payer: Medicaid Other

## 2022-10-09 ENCOUNTER — Other Ambulatory Visit (HOSPITAL_COMMUNITY)
Admission: RE | Admit: 2022-10-09 | Discharge: 2022-10-09 | Disposition: A | Discharge status: Home / Self Care | Payer: BC Managed Care – PPO | Source: Ambulatory Visit

## 2022-10-09 VITALS — BP 94/76 | HR 88 | Wt 129.9 lb

## 2022-10-09 DIAGNOSIS — J45909 Unspecified asthma, uncomplicated: Secondary | ICD-10-CM | POA: Insufficient documentation

## 2022-10-09 DIAGNOSIS — Z0184 Encounter for antibody response examination: Secondary | ICD-10-CM

## 2022-10-09 DIAGNOSIS — Z3A12 12 weeks gestation of pregnancy: Secondary | ICD-10-CM

## 2022-10-09 DIAGNOSIS — Z3481 Encounter for supervision of other normal pregnancy, first trimester: Secondary | ICD-10-CM

## 2022-10-09 DIAGNOSIS — Z113 Encounter for screening for infections with a predominantly sexual mode of transmission: Secondary | ICD-10-CM | POA: Diagnosis present

## 2022-10-09 DIAGNOSIS — Z0283 Encounter for blood-alcohol and blood-drug test: Secondary | ICD-10-CM

## 2022-10-09 DIAGNOSIS — Z1379 Encounter for other screening for genetic and chromosomal anomalies: Secondary | ICD-10-CM

## 2022-10-09 DIAGNOSIS — T7589XA Other specified effects of external causes, initial encounter: Secondary | ICD-10-CM

## 2022-10-09 DIAGNOSIS — Z130 Encounter for screening for diseases of the blood and blood-forming organs and certain disorders involving the immune mechanism: Secondary | ICD-10-CM

## 2022-10-09 DIAGNOSIS — N83202 Unspecified ovarian cyst, left side: Secondary | ICD-10-CM

## 2022-10-09 DIAGNOSIS — Z124 Encounter for screening for malignant neoplasm of cervix: Secondary | ICD-10-CM

## 2022-10-09 HISTORY — DX: Unspecified asthma, uncomplicated: J45.909

## 2022-10-09 NOTE — Progress Notes (Signed)
NEW OB HISTORY AND PHYSICAL  SUBJECTIVE:       Destiny Mckenzie is a 22 y.o. G2P1001 female, No LMP recorded. Patient is pregnant., Estimated Date of Delivery: 04/23/23, [redacted]w[redacted]d, confirmed by 8 week ultrasound presents today for establishment of Prenatal Care. She reports nausea, vomiting, and acid reflux. Managing well without medication.   Social history Partner/Relationship: Destiny Bee "Dalton" Living situation: lives with Destiny Bee and son, [name] Work: Education officer, museum, Theatre manager Exercise: chases around toddler  Substance use: none   Gynecologic History Pregnancy dating reviewed:  No LMP recorded. Patient is pregnant. Normal Contraception: none Last Pap: 2022. 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   Obstetric History OB History  Gravida Para Term Preterm AB Living  2 1 1  0 0 1  SAB IAB Ectopic Multiple Live Births  0 0 0 0 1    # Outcome Date GA Lbr Len/2nd Weight Sex Type Anes PTL Lv  2 Current           1 Term 11/20/20 [redacted]w[redacted]d 783:12 / 00:31 5 lb 13.1 oz (2.64 kg) M Vag-Spont EPI  LIV    Past Medical History:  Diagnosis Date   Allergy    Encounter for supervision of normal pregnancy 04/21/2020   Formatting of this note is different from the original.  22 y.o. G1P0 at [redacted]w[redacted]d Patient's last menstrual period was 02/09/2020 (approximate). inconsistent with ultrasound @ [redacted]w[redacted]d. Estimated Date of Delivery: 12/10/20  Sex of baby and name:  " "   FOB:       Factors complicating this pregnancy   1. IOL scheduled for 11/20/20 at 0001 for Cholestasis   JO  2. Large simple ovarian cyst   5.1cmon 3/30    Fracture of ankle, left, closed 2012 and 2013   Headache(784.0)    Mononucleosis 01/24/2012   Ovarian cyst    Seasonal allergies     Past Surgical History:  Procedure Laterality Date   APPENDECTOMY     LAPAROSCOPIC APPENDECTOMY N/A 10/07/2013   Procedure: APPENDECTOMY LAPAROSCOPIC;   Surgeon: Dalia Heading, MD;  Location: [facility];  Service: General;  Laterality: N/A;   TONSILLECTOMY     TONSILLECTOMY  01/30/2012   WISDOM TOOTH EXTRACTION     three; age 1    Current Outpatient Medications on File Prior to Visit  Medication Sig Dispense Refill   Prenatal Vit-Fe Fumarate-FA (PRENATAL MULTIVITAMIN) TABS tablet Take 1 tablet by mouth daily at 12 noon.     Ferrous Sulfate (IRON PO) Take 1 tablet by mouth daily. (Patient not taking: Reported on 09/11/2022)     No current facility-administered medications on file prior to visit.    Allergies  Allergen Reactions   Iodine Rash   Sulfa Antibiotics Rash   Tramadol Other (See Comments)   Fleet Enema [Enema]    Bactrim [Sulfamethoxazole-Trimethoprim] Rash   Contrast Media [Iodinated Contrast Media] Rash   Hydroxyzine Rash   Other Rash   Polyethylene Glycol Rash   Sodium Phosphates Rash    Social History   Socioeconomic History   Marital status: Single    Spouse name: Not on file   Number of children: 0   Years of education: 13.5   Highest education level: Not on file  Occupational History   Occupation: Theatre manager at [employer]  Tobacco Use   Smoking status: Former    Current packs/day: 0.00    Types:  Cigarettes    Quit date: 2018    Years since quitting: 6.7   Smokeless tobacco: Never  Vaping Use   Vaping status: Former   Substances: Nicotine  Substance and Sexual Activity   Alcohol use: Not Currently    Comment: last use- 2 weeks ago- PVR coffee   Drug use: No   Sexual activity: Yes    Partners: Male    Birth control/protection: None    Comment: hx implant, ocp. no bcm when became preg  Other Topics Concern   Not on file  Social History Narrative   ** Merged History Encounter **       Social Determinants of Health   Financial Resource Strain: Medium Risk (09/11/2022)   Overall Financial Resource Strain (CARDIA)    Difficulty of Paying Living Expenses: Somewhat hard  Food  Insecurity: No Food Insecurity (09/11/2022)   Hunger Vital Sign    Worried About Running Out of Food in the Last Year: Never true    Ran Out of Food in the Last Year: Never true  Transportation Needs: No Transportation Needs (09/11/2022)   PRAPARE - Administrator, Civil Service (Medical): No    Lack of Transportation (Non-Medical): No  Physical Activity: Inactive (09/11/2022)   Exercise Vital Sign    Days of Exercise per Week: 0 days    Minutes of Exercise per Session: 0 min  Stress: No Stress Concern Present (09/11/2022)   [employer] of Occupational Health - Occupational Stress Questionnaire    Feeling of Stress : Not at all  Social Connections: Moderately Integrated (09/11/2022)   Social Connection and Isolation Panel [NHANES]    Frequency of Communication with Friends and Family: More than three times a week    Frequency of Social Gatherings with Friends and Family: More than three times a week    Attends Religious Services: More than 4 times per year    Active Member of [employer] or Organizations: No    Attends Banker Meetings: Never    Marital Status: Living with partner  Intimate Partner Violence: Not At Risk (09/11/2022)   Humiliation, Afraid, Rape, and Kick questionnaire    Fear of Current or Ex-Partner: No    Emotionally Abused: No    Physically Abused: No    Sexually Abused: No    Family History  Problem Relation Age of Onset   Obesity Mother    Polycystic ovary syndrome Mother    Cancer Father 28       leukemia   Migraines Sister    Cancer Maternal Grandmother 50       skin   Hypertension Maternal Grandfather    Diabetes Maternal Grandfather    Heart Problems Maternal Grandfather    Healthy Paternal Grandmother    Hypertension Paternal Grandfather    Diabetes Paternal Grandfather     The following portions of the patient's history were reviewed and updated as appropriate: allergies, current medications, past OB history, past medical  history, past surgical history, past family history, past social history, and problem list.  Constitutional: Denied constitutional symptoms, night sweats, recent illness, fatigue, fever, insomnia and weight loss.  Eyes: Denied eye symptoms, eye pain, photophobia, vision change and visual disturbance.  Ears/Nose/Throat/Neck: Denied ear, nose, throat or neck symptoms, hearing loss, nasal discharge, sinus congestion and sore throat.  Cardiovascular: Denied cardiovascular symptoms, arrhythmia, chest pain/pressure, edema, exercise intolerance, orthopnea and palpitations.  Respiratory: Denied pulmonary symptoms, asthma, pleuritic pain, productive sputum, cough, dyspnea and wheezing.  Gastrointestinal: Positive for N/V and acid reflux.  Genitourinary: Denied genitourinary symptoms including symptomatic vaginal discharge, pelvic relaxation issues, and urinary complaints.  Musculoskeletal: Denied musculoskeletal symptoms, stiffness, swelling, muscle weakness and myalgia.  Dermatologic: Denied dermatology symptoms, rash and scar.  Neurologic: Denied neurology symptoms, dizziness, headache, neck pain and syncope.  Psychiatric: Denied psychiatric symptoms, anxiety and depression.  Endocrine: Denied endocrine symptoms including hot flashes and night sweats.     OBJECTIVE: Initial Physical Exam (New OB)  GENERAL APPEARANCE: alert, well appearing HEAD: normocephalic, atraumatic MOUTH: mucous membranes moist, pharynx normal without lesions THYROID: no thyromegaly or masses present BREASTS: deferred LUNGS: clear to auscultation, no wheezes, rales or rhonchi, symmetric air entry HEART: regular rate and rhythm, no murmurs ABDOMEN: gravid, soft, nontender, nondistended, no abnormal masses, no epigastric pain EXTREMITIES: no redness or tenderness in the calves or thighs SKIN: normal coloration and turgor, no rashes NEUROLOGIC: alert, oriented, normal speech, no focal findings or movement disorder  noted  PELVIC EXAM: deferred  ASSESSMENT/PLAN  Normal pregnancy  Patient does not meet criteria for low dose ASA therapy.    Routine prenatal care. We discussed an overview of prenatal care and when to call. Reviewed diet, exercise, and weight gain recommendations in pregnancy. Discussed benefits of breastfeeding and lactation resources at [facility]. I reviewed labs and answered all questions.  Anatomy ultrasound ordered for 18-20 weeks.  NOB labs: collected today with [facility] genetic screening  See orders  Autumn Messing, [place] 10/09/22 3:11 PM

## 2022-10-10 LAB — CBC/D/PLT+RPR+RH+ABO+RUBIGG...
Antibody Screen: NEGATIVE
Basophils Absolute: 0.1 10*3/uL (ref 0.0–0.2)
Basos: 1 %
EOS (ABSOLUTE): 0.2 10*3/uL (ref 0.0–0.4)
Eos: 2 %
HCV Ab: NONREACTIVE
HIV Screen 4th Generation wRfx: NONREACTIVE
Hematocrit: 37.5 % (ref 34.0–46.6)
Hemoglobin: 12.5 g/dL (ref 11.1–15.9)
Hepatitis B Surface Ag: NEGATIVE
Immature Grans (Abs): 0.1 10*3/uL (ref 0.0–0.1)
Immature Granulocytes: 1 %
Lymphocytes Absolute: 2.9 10*3/uL (ref 0.7–3.1)
Lymphs: 27 %
MCH: 31.6 pg (ref 26.6–33.0)
MCHC: 33.3 g/dL (ref 31.5–35.7)
MCV: 95 fL (ref 79–97)
Monocytes Absolute: 0.9 10*3/uL (ref 0.1–0.9)
Monocytes: 9 %
Neutrophils Absolute: 6.4 10*3/uL (ref 1.4–7.0)
Neutrophils: 60 %
Platelets: 482 10*3/uL — ABNORMAL HIGH (ref 150–450)
RBC: 3.96 x10E6/uL (ref 3.77–5.28)
RDW: 11.9 % (ref 11.7–15.4)
RPR Ser Ql: NONREACTIVE
Rh Factor: POSITIVE
Rubella Antibodies, IGG: 2 {index} (ref >0.99)
Varicella zoster IgG: 516 {index} (ref >165)
WBC: 10.5 10*3/uL (ref 3.4–10.8)

## 2022-10-10 LAB — URINALYSIS, ROUTINE W REFLEX MICROSCOPIC
Bilirubin, UA: NEGATIVE
Glucose, UA: NEGATIVE
Ketones, UA: NEGATIVE
Leukocytes,UA: NEGATIVE
Nitrite, UA: NEGATIVE
Protein,UA: NEGATIVE
RBC, UA: NEGATIVE
Specific Gravity, UA: 1.015 (ref 1.005–1.030)
Urobilinogen, Ur: 0.2 mg/dL (ref 0.2–1.0)
pH, UA: 7 (ref 5.0–7.5)

## 2022-10-10 LAB — HCV INTERPRETATION

## 2022-10-13 ENCOUNTER — Telehealth: Payer: Self-pay

## 2022-10-13 LAB — PANORAMA PRENATAL TEST FULL PANEL:PANORAMA TEST PLUS 5 ADDITIONAL MICRODELETIONS

## 2022-10-13 NOTE — Telephone Encounter (Addendum)
Pt left msg on triage saying test result has a discrepancy. Called pt back and looking through her mychart it is the [facility] test. I don't know what usually happens here, if [facility] reaches out to [place]? Sending to Georgiann Hahn since she worked with Maralyn Sago day of collection. Pt aware Georgiann Hahn out today but will look into this upon return.

## 2022-10-16 NOTE — Addendum Note (Signed)
Addended by: Autumn Messing on: 10/16/2022 08:30 AM   Modules accepted: Orders

## 2022-10-17 ENCOUNTER — Other Ambulatory Visit: Payer: Medicaid Other

## 2022-10-17 DIAGNOSIS — Z3481 Encounter for supervision of other normal pregnancy, first trimester: Secondary | ICD-10-CM

## 2022-10-23 LAB — PANORAMA PRENATAL TEST FULL PANEL:PANORAMA TEST PLUS 5 ADDITIONAL MICRODELETIONS: FETAL FRACTION: 12.4

## 2022-11-06 ENCOUNTER — Ambulatory Visit (INDEPENDENT_AMBULATORY_CARE_PROVIDER_SITE_OTHER): Payer: Medicaid Other | Admitting: Licensed Practical Nurse

## 2022-11-06 ENCOUNTER — Encounter: Payer: Self-pay | Admitting: Licensed Practical Nurse

## 2022-11-06 VITALS — BP 111/69 | HR 96 | Wt 134.9 lb

## 2022-11-06 DIAGNOSIS — Z3480 Encounter for supervision of other normal pregnancy, unspecified trimester: Secondary | ICD-10-CM

## 2022-11-06 DIAGNOSIS — Z3A16 16 weeks gestation of pregnancy: Secondary | ICD-10-CM

## 2022-11-06 LAB — POCT URINALYSIS DIPSTICK
Bilirubin, UA: NEGATIVE
Blood, UA: NEGATIVE
Glucose, UA: NEGATIVE
Ketones, UA: NEGATIVE
Leukocytes, UA: NEGATIVE
Nitrite, UA: NEGATIVE
Protein, UA: NEGATIVE
Spec Grav, UA: 1.02 (ref 1.010–1.025)
Urobilinogen, UA: 0.2 U/dL
pH, UA: 6.5 (ref 5.0–8.0)

## 2022-11-06 NOTE — Progress Notes (Signed)
Routine Prenatal Care Visit  Subjective  Destiny Mckenzie is a 22 y.o. G2P1001 at [redacted]w[redacted]d being seen today for ongoing prenatal care.  She is currently monitored for the following issues for this low-risk pregnancy and has Lumbar radiculopathy; History of cholestasis during pregnancy; Supervision of other normal pregnancy, antepartum; Ovarian cyst, left; and Allergy-induced asthma on their problem list.  ----------------------------------------------------------------------------------- Patient reports fatigue.  Low back pain, comfort measures reviewed Doing well. Felt a little kick.  Declines AFP Is not planing to travel Her MIL works at [facility], was concerned about keeping her medical information private, reviewed her MIL should not be able to access her chart.   Contractions: Not present. Vag. Bleeding: None.  Movement: Absent. Leaking Fluid denies.  ----------------------------------------------------------------------------------- The following portions of the patient's history were reviewed and updated as appropriate: allergies, current medications, past family history, past medical history, past social history, past surgical history and problem list. Problem list updated.  Objective  Blood pressure 111/69, pulse 96, weight 134 lb 14.4 oz (61.2 kg), unknown if currently breastfeeding. Pregravid weight 116 lb (52.6 kg) Total Weight Gain 18 lb 14.4 oz (8.573 kg) Urinalysis: Urine Protein    Urine Glucose    Fetal Status: Fetal Heart Rate (bpm): 155   Movement: Absent     General:  Alert, oriented and cooperative. Patient is in no acute distress.  Skin: Skin is warm and dry. No rash noted.   Cardiovascular: Normal heart rate noted  Respiratory: Normal respiratory effort, no problems with respiration noted  Abdomen: Soft, gravid, appropriate for gestational age. Pain/Pressure: Absent     Pelvic:  Cervical exam deferred        Extremities: Normal range of motion.  Edema: None  Mental Status:  Normal mood and affect. Normal behavior. Normal judgment and thought content.   Assessment   22 y.o. G2P1001 at [redacted]w[redacted]d by  04/23/2023, by Ultrasound presenting for routine prenatal visit  Plan   second Problems (from 09/11/22 to present)     Problem Noted Resolved   Allergy-induced asthma 10/09/2022 by Free, Lindalou Hose, CNM No   Overview Signed 10/09/2022  3:18 PM by Burney Gauze, CNM    Uses inhaler occasionally in spring      Ovarian cyst, left 10/06/2022 by Burney Gauze, CNM No   Overview Addendum 10/06/2022  2:34 PM by Burney Gauze, CNM    Noted with first pregnancy, measuring 3.94 x 3.26 x 3.64 cm  at dating [place] with G2      Supervision of other normal pregnancy, antepartum 09/11/2022 by Loran Senters, CMA No   Overview Addendum 09/11/2022 11:44 AM by Loran Senters, CMA     Clinical Staff Provider  Office Location  [facility] Dating  Not found.  Language  English Anatomy US    Flu Vaccine  offer Genetic Screen  NIPS:   TDaP vaccine   offer Hgb A1C or  GTT Early : Third trimester :   Covid declined   LAB RESULTS   Rhogam     Blood Type     Feeding Plan pump Antibody    Contraception nexplanon Rubella    Circumcision yes RPR     Pediatrician  [facility]     Support Person [place] [facility]    Prenatal Classes no Varicella      GBS  (For PCN allergy, check sensitivities)   BTL Consent  Hep C     VBAC Consent  Pap No results found for: "DIAGPAP"  Hgb Electro      CF      SMA                    Preterm labor symptoms and general obstetric precautions including but not limited to vaginal bleeding, contractions, leaking of fluid and fetal movement were reviewed in detail with the patient. Please refer to After Visit Summary for other counseling recommendations.   Return in about 4 weeks (around 12/04/2022) for ROB.  Anatomy US ordered   Destiny Mckenzie  [facility]  11/06/22  3:37 PM

## 2022-11-22 ENCOUNTER — Telehealth: Payer: Self-pay | Admitting: Obstetrics

## 2022-11-22 NOTE — Telephone Encounter (Signed)
Spoke with patient today about scheduling her anatomy ultrasound. Patient states she is now being seen at her previous OBGYN([facility]). [place] and OB appt cancelled per patient request.

## 2022-12-04 ENCOUNTER — Other Ambulatory Visit: Payer: Medicaid Other

## 2022-12-05 DIAGNOSIS — Z3483 Encounter for supervision of other normal pregnancy, third trimester: Secondary | ICD-10-CM | POA: Insufficient documentation

## 2023-01-24 NOTE — L&D Delivery Note (Signed)
 Delivery Note  Destiny Mckenzie is a G2P1001 at [redacted]w[redacted]d dated by [place] at [redacted]w[redacted]d.   First Stage: Labor onset: 1345 Induction: misoprostol, oxytocin, and AROM Analgesia /Anesthesia intrapartum: Epidural and IV pain meds Date/time: 04/07/23 1534, Amount: gush, and Color: clear GBS: POSITIVE/-- (03/01 1758)  IP Antibiotics: abx: PCN x 4 doses  Second Stage: Complete dilation at 1638 Onset of pushing at 1638 FHR second stage Baseline: 145 bpm, Variability: Good {> 6 bpm), Accelerations: Reactive, and Decelerations: Variable: mild decels with pushing   Destiny Mckenzie presented to L&D for IOL She was 1.5/40/-3. She progressed  to C/C/+2 with a spontaneous urge to push.  She pushed  effectively over approximately 30 seconds for a spontaneous vaginal birth. Delivery of a viable baby boy on 04/07/2023 . by CNM. Delivery of fetal head in position: Occiput,, Anterior position with restitution to position: Left,, Occiput,, Transverse no nuchal cord;  Anterior then posterior shoulders delivered easily with gentle downward traction. Baby placed on mom's chest, and attended to by baby RN. Cord double clamped after cessation of pulsation, cut by Paternal grandmother of baby    Third Stage: Oxytocin bolus started after delivery of infant for hemorrhage prophylaxis  Placenta delivered [facility] intact with 3 VC @ 1644 Placenta disposition: To Pathology: No  Uterine tone firm / exam; vaginal bleeding: minimal  Laceration: n/a laceration identified  Anesthesia for repair: procedures; anesthesia: none Repair none Est. Blood Loss (mL): 50  Complications:Diagnoses; OB-GYN delivery complications: none  Mom to postpartum.  Baby to Couplet care / Skin to Skin.  Newborn: Information for the patient's newborn:  Jaelen, Gellerman [[id number]]  Live born female Bowen Birth Weight:   APGAR: 8, 9  Newborn Delivery   Birth date/time: 04/07/2023 16:38:00 Delivery type:       Feeding planned: formula  feeding  ---------- Chari Manning, CNM Certified Nurse Midwife [facility]

## 2023-03-01 ENCOUNTER — Observation Stay
Admission: EM | Admit: 2023-03-01 | Discharge: 2023-03-01 | Disposition: A | Discharge status: Home / Self Care | Payer: BC Managed Care – PPO | Attending: Certified Nurse Midwife | Admitting: Certified Nurse Midwife

## 2023-03-01 ENCOUNTER — Encounter: Payer: Self-pay | Admitting: Obstetrics and Gynecology

## 2023-03-01 ENCOUNTER — Other Ambulatory Visit: Payer: Self-pay

## 2023-03-01 DIAGNOSIS — O99613 Diseases of the digestive system complicating pregnancy, third trimester: Secondary | ICD-10-CM | POA: Diagnosis present

## 2023-03-01 DIAGNOSIS — Z87891 Personal history of nicotine dependence: Secondary | ICD-10-CM | POA: Diagnosis not present

## 2023-03-01 DIAGNOSIS — O09513 Supervision of elderly primigravida, third trimester: Secondary | ICD-10-CM | POA: Diagnosis not present

## 2023-03-01 DIAGNOSIS — Z3A32 32 weeks gestation of pregnancy: Secondary | ICD-10-CM | POA: Insufficient documentation

## 2023-03-01 DIAGNOSIS — K5900 Constipation, unspecified: Secondary | ICD-10-CM | POA: Diagnosis not present

## 2023-03-01 DIAGNOSIS — J45909 Unspecified asthma, uncomplicated: Secondary | ICD-10-CM

## 2023-03-01 DIAGNOSIS — N83202 Unspecified ovarian cyst, left side: Secondary | ICD-10-CM

## 2023-03-01 DIAGNOSIS — Z3480 Encounter for supervision of other normal pregnancy, unspecified trimester: Principal | ICD-10-CM

## 2023-03-01 MED ORDER — ACETAMINOPHEN 325 MG PO TABS
650.0000 mg | ORAL_TABLET | ORAL | Status: DC | PRN
  Administered 2023-03-01: 650 mg via ORAL
  Filled 2023-03-01: qty 2

## 2023-03-01 MED ORDER — DOCUSATE SODIUM 100 MG PO CAPS
100.0000 mg | ORAL_CAPSULE | Freq: Every day | ORAL | Status: DC
  Administered 2023-03-01: 100 mg via ORAL
  Filled 2023-03-01: qty 1

## 2023-03-01 MED ORDER — DOCUSATE SODIUM 100 MG PO CAPS: 100.0000 mg | ORAL_CAPSULE | Freq: Every day | ORAL | 3 refills | Status: AC

## 2023-03-01 MED ORDER — CALCIUM CARBONATE ANTACID 500 MG PO CHEW: 2.0000 | CHEWABLE_TABLET | ORAL | Status: DC | PRN

## 2023-03-01 NOTE — Discharge Summary (Addendum)
 Destiny Mckenzie is a 23 y.o. female. She is at [redacted]w[redacted]d gestation. No LMP recorded. Patient is pregnant. Estimated Date of Delivery: 04/23/23  Prenatal care site: [facility]    Current pregnancy complicated by: Hx of cholestasis  Chief complaint: constipation  She reports constipation this afternoon that started around 1630 with abdominal pain. She tried to have a bowel movement but was unable to while at work. Driving home, she stopped at the store and was in the bathroom for an hour trying to pass the bowel movement but was unable to. She reports she got home and continued trying to have a bowel movement and could see the stool but was unable to pass any of it. She reports intense pain from trying to have a bowel movement. Her last normal bowel movement was 4 days ago. She has had a normal appetite. She denies uterine contractions or vaginal bleeding. She endorses good fetal movement.   S: Standing in the bathroom reporting abdominal pain and the need to have a bowel movement. no CTX, no VB.no LOF,  Active fetal movement.  Denies: HA, visual changes, SOB, or RUQ/epigastric pain  Maternal Medical History:   Past Medical History:  Diagnosis Date   Allergy    Allergy-induced asthma 10/09/2022   Encounter for supervision of normal pregnancy 04/21/2020   Formatting of this note is different from the original.  23 y.o. G1P0 at [redacted]w[redacted]d Patient's last menstrual period was 02/09/2020 (approximate). inconsistent with ultrasound @ [redacted]w[redacted]d. Estimated Date of Delivery: 12/10/20  Sex of baby and name:      FOB:       Factors complicating this pregnancy   1. IOL scheduled for 11/20/20 at 0001 for Cholestasis   JO  2. Large simple ovarian cyst   5.1cmon 3/30    Fracture of ankle, left, closed 2012 and 2013   Headache(784.0)    Mononucleosis 01/24/2012   Ovarian cyst    Seasonal allergies     Past Surgical History:  Procedure Laterality Date   APPENDECTOMY     LAPAROSCOPIC APPENDECTOMY N/A  10/07/2013   Procedure: APPENDECTOMY LAPAROSCOPIC;  Surgeon: Oneil DELENA Budge, MD;  Location: [facility];  Service: General;  Laterality: N/A;   TONSILLECTOMY     TONSILLECTOMY  01/30/2012   WISDOM TOOTH EXTRACTION     three; age 84    Allergies  Allergen Reactions   Iodine  Rash   Sulfa Antibiotics Rash   Tramadol Other (See Comments)   Fleet Enema [Enema]    Bactrim [Sulfamethoxazole-Trimethoprim] Rash   Contrast Media [Iodinated Contrast Media] Rash   Hydroxyzine Rash   Other Rash   Polyethylene Glycol Rash   Sodium Phosphates Rash    Prior to Admission medications   Medication Sig Start Date End Date Taking? Authorizing Provider  Prenatal Vit-Fe Fumarate-FA (PRENATAL MULTIVITAMIN) TABS tablet Take 1 tablet by mouth daily at 12 noon.   Yes [provider]  docusate sodium  (COLACE) 100 MG capsule Take 1 capsule (100 mg total) by mouth daily. 03/01/23   Tanda Edsel Fuller, CNM  Ferrous Sulfate (IRON PO) Take 1 tablet by mouth daily. Patient not taking: Reported on 09/11/2022 10/14/20   [provider]    Social History: She  reports that she quit smoking about 7 years ago. Her smoking use included cigarettes. She has never used smokeless tobacco. She reports that she does not currently use alcohol. She reports that she does not use drugs.  Family History: family history includes Cancer (age of  onset: 11) in her father; Cancer (age of onset: 51) in her maternal grandmother; Diabetes in her maternal grandfather and paternal grandfather; Healthy in her paternal grandmother; Heart Problems in her maternal grandfather; Hypertension in her maternal grandfather and paternal grandfather; Migraines in her sister; Obesity in her mother; Polycystic ovary syndrome in her mother.  no history of gyn cancers  Review of Systems: A full review of systems was performed and negative except as noted in the HPI.    O:  BP 105/63   Pulse 83   Temp 98.7 F (37.1 C)   Resp 18   Ht 5' 3  (1.6 m)   Wt 71.7 kg   BMI 27.99 kg/m  No results found for this or any previous visit (from the past 48 hours).   Constitutional: NAD, AAOx3  HE/ENT: extraocular movements grossly intact, moist mucous membranes CV: RRR PULM: nl respiratory effort, CTABL     Abd: gravid, tender, distended, soft      Ext: Non-tender, Nonedematous   Psych: mood appropriate, speech normal Pelvic: deferred  Fetal  monitoring: Cat 1 Appropriate for GA Baseline: 135bpm Variability: moderate Accelerations: present x >2 Decelerations absent Uterine contractions: initially q2-36min, now rare Time 3 hours  A/P: 23 y.o. [redacted]w[redacted]d here for antenatal surveillance for constipation  Principle Diagnosis:  Constipation in pregnancy  Constipation: digital disimpaction with very large amount of hard stool removed, performed by Asberry Helling, RN. Patient then had a productive bowel movement and felt much better. Sent Rx for Colace 100mg  daily. Abdomen noted to be soft, non-distended, and non-tender after bowel movement.  Labor: not present. Preterm uterine contractions spaced out after hydrating and rest after having bowel movement Fetal Wellbeing: Reassuring Cat 1 tracing. Reactive NST  D/c home stable, precautions reviewed, follow-up as scheduled.    Edsel Charlies Blush, CNM 03/01/2023 10:14 PM

## 2023-03-01 NOTE — OB Triage Note (Signed)
 Pt arrives due to constipation. Pt expresses no other complaints.

## 2023-03-07 DIAGNOSIS — E559 Vitamin D deficiency, unspecified: Secondary | ICD-10-CM | POA: Diagnosis present

## 2023-03-07 DIAGNOSIS — E538 Deficiency of other specified B group vitamins: Secondary | ICD-10-CM | POA: Diagnosis present

## 2023-03-24 ENCOUNTER — Observation Stay
Admission: EM | Admit: 2023-03-24 | Discharge: 2023-03-24 | Disposition: A | Discharge status: Home / Self Care | Attending: Certified Nurse Midwife | Admitting: Certified Nurse Midwife

## 2023-03-24 ENCOUNTER — Encounter: Payer: Self-pay | Admitting: Obstetrics and Gynecology

## 2023-03-24 ENCOUNTER — Other Ambulatory Visit: Payer: Self-pay

## 2023-03-24 DIAGNOSIS — Z79899 Other long term (current) drug therapy: Secondary | ICD-10-CM | POA: Insufficient documentation

## 2023-03-24 DIAGNOSIS — O99513 Diseases of the respiratory system complicating pregnancy, third trimester: Secondary | ICD-10-CM | POA: Diagnosis not present

## 2023-03-24 DIAGNOSIS — Z3480 Encounter for supervision of other normal pregnancy, unspecified trimester: Principal | ICD-10-CM

## 2023-03-24 DIAGNOSIS — Z87891 Personal history of nicotine dependence: Secondary | ICD-10-CM | POA: Diagnosis not present

## 2023-03-24 DIAGNOSIS — O99891 Other specified diseases and conditions complicating pregnancy: Secondary | ICD-10-CM | POA: Diagnosis not present

## 2023-03-24 DIAGNOSIS — Z3A35 35 weeks gestation of pregnancy: Secondary | ICD-10-CM | POA: Diagnosis not present

## 2023-03-24 DIAGNOSIS — O479 False labor, unspecified: Secondary | ICD-10-CM | POA: Diagnosis present

## 2023-03-24 DIAGNOSIS — O4703 False labor before 37 completed weeks of gestation, third trimester: Secondary | ICD-10-CM | POA: Diagnosis present

## 2023-03-24 DIAGNOSIS — M549 Dorsalgia, unspecified: Secondary | ICD-10-CM | POA: Insufficient documentation

## 2023-03-24 DIAGNOSIS — J45909 Unspecified asthma, uncomplicated: Secondary | ICD-10-CM | POA: Diagnosis not present

## 2023-03-24 DIAGNOSIS — N83202 Unspecified ovarian cyst, left side: Secondary | ICD-10-CM

## 2023-03-24 LAB — WET PREP, GENITAL
Clue Cells Wet Prep HPF POC: NONE SEEN
Sperm: NONE SEEN
Trich, Wet Prep: NONE SEEN
WBC, Wet Prep HPF POC: <10 (ref <10)
Yeast Wet Prep HPF POC: NONE SEEN

## 2023-03-24 LAB — URINALYSIS, ROUTINE W REFLEX MICROSCOPIC
Bilirubin Urine: NEGATIVE
Glucose, UA: NEGATIVE mg/dL
Hgb urine dipstick: NEGATIVE
Ketones, ur: NEGATIVE mg/dL
Leukocytes,Ua: NEGATIVE
Nitrite: NEGATIVE
Protein, ur: NEGATIVE mg/dL
Specific Gravity, Urine: 1.004 — ABNORMAL LOW (ref 1.005–1.030)
pH: 6 (ref 5.0–8.0)

## 2023-03-24 LAB — CHLAMYDIA/NGC RT PCR (ARMC ONLY)
Chlamydia Tr: NOT DETECTED
N gonorrhoeae: NOT DETECTED

## 2023-03-24 LAB — GROUP B STREP BY PCR: Group B strep by PCR: POSITIVE — AB

## 2023-03-24 LAB — RUPTURE OF MEMBRANE (ROM)PLUS: Rom Plus: NEGATIVE

## 2023-03-24 MED ORDER — LACTATED RINGERS IV BOLUS
1000.0000 mL | Freq: Once | INTRAVENOUS | Status: AC
  Administered 2023-03-24: 1000 mL via INTRAVENOUS

## 2023-03-24 MED ORDER — ACETAMINOPHEN 500 MG PO TABS
1000.0000 mg | ORAL_TABLET | ORAL | Status: DC | PRN
  Administered 2023-03-24: 1000 mg via ORAL
  Filled 2023-03-24: qty 2

## 2023-03-24 NOTE — Discharge Summary (Cosign Needed Addendum)
 Destiny Mckenzie is a 23 y.o. female. She is at [redacted]w[redacted]d gestation. No LMP recorded. Patient is pregnant. Estimated Date of Delivery: 04/23/23  Prenatal care site: [facility]  Chief complaint: Uterine contractions   HPI: Destiny Mckenzie presents to L&D with concerns of uterine contractions. Reports uterine contractions started approximately between 1500-1600 today (03/24/2023). She states that are irregular and have occur about every 8 minutes. Reports she is staying hydrated and has been voiding when she has the urge, therefore not holding her urine. She also reports leakage of fluid. She is unable to describe the color of fluid. She states she has active fetal movement and denies vaginal bleeding. She states she does not have a history of preterm delivery.   Factors complicating pregnancy: History of cholestasis of pregnancy Iron deficiency Vitamin B12 deficiency Vitamin D deficiency  S: Resting comfortably. No vaginal bleeding and no leakage of fluid. Endorses active fetal movement.   Maternal Medical History:  Past Medical Hx:  has a past medical history of Allergy, Allergy-induced asthma (10/09/2022), Encounter for supervision of normal pregnancy (04/21/2020), Fracture of ankle, left, closed (2012 and 2013), Headache(784.0), Mononucleosis (01/24/2012), Ovarian cyst, and Seasonal allergies.    Past Surgical Hx:  has a past surgical history that includes Tonsillectomy; Tonsillectomy (01/30/2012); laparoscopic appendectomy (N/A, 10/07/2013); Appendectomy; and Wisdom tooth extraction.   Allergies  Allergen Reactions   Iodine Rash   Sulfa Antibiotics Rash   Tramadol Other (See Comments)    "Mean"   Bactrim [Sulfamethoxazole-Trimethoprim] Rash   Contrast Media [Iodinated Contrast Media] Rash   Fleet Enema [Enema] Rash   Hydroxyzine Rash   Other Rash   Polyethylene Glycol Rash   Sodium Phosphates Rash     Prior to Admission medications   Medication Sig Start Date End Date Taking?  Authorizing Provider  docusate sodium (COLACE) 100 MG capsule Take 1 capsule (100 mg total) by mouth daily. 03/01/23   Janyce Llanos, CNM  Ferrous Sulfate (IRON PO) Take 1 tablet by mouth daily. Patient not taking: Reported on 09/11/2022 10/14/20   [provider]  Prenatal Vit-Fe Fumarate-FA (PRENATAL MULTIVITAMIN) TABS tablet Take 1 tablet by mouth daily at 12 noon.    [provider]    Social History: She  reports that she quit smoking about 7 years ago. Her smoking use included cigarettes. She has never used smokeless tobacco. She reports that she does not currently use alcohol. She reports that she does not use drugs.  Family History: family history includes Cancer (age of onset: 34) in her father; Cancer (age of onset: 31) in her maternal grandmother; Diabetes in her maternal grandfather and paternal grandfather; Healthy in her paternal grandmother; Heart Problems in her maternal grandfather; Hypertension in her maternal grandfather and paternal grandfather; Migraines in her sister; Obesity in her mother; Polycystic ovary syndrome in her mother. No history of gyn cancers  Review of Systems:  Review of Systems  Constitutional: Negative.   Gastrointestinal:  Positive for abdominal pain (intermittent strong uterine contractions).  Genitourinary: Negative.   Musculoskeletal:  Positive for back pain.  Neurological: Negative.   Psychiatric/Behavioral: Negative.       O:  BP 114/68 (BP Location: Right Arm)   Pulse (!) 102   Temp 98.4 F (36.9 C) (Oral)   Resp 16   Ht 5\' 3"  (1.6 m)   Wt 72.6 kg   BMI 28.34 kg/m  Results for orders placed or performed during the hospital encounter of 03/24/23 (from the past 48 hours)  Group B strep by PCR   Collection Time: 03/24/23  5:58 PM   Specimen: Vaginal/Rectal; Genital  Result Value Ref Range   Group B strep by PCR POSITIVE (A) PRESUMPTIVE NEGATIVE  Wet prep, genital   Collection Time: 03/24/23  5:59 PM  Result  Value Ref Range   Yeast Wet Prep HPF POC NONE SEEN NONE SEEN   Trich, Wet Prep NONE SEEN NONE SEEN   Clue Cells Wet Prep HPF POC NONE SEEN NONE SEEN   WBC, Wet Prep HPF POC <10 <10   Sperm NONE SEEN   Chlamydia/NGC rt PCR ([facility] only)   Collection Time: 03/24/23  5:59 PM  Result Value Ref Range   Specimen source GC/Chlam ENDOCERVICAL    Chlamydia Tr NOT DETECTED NOT DETECTED   N gonorrhoeae NOT DETECTED NOT DETECTED  Rupture of Membrane (ROM) Plus   Collection Time: 03/24/23  5:59 PM  Result Value Ref Range   Rom Plus NEGATIVE   Urinalysis, Routine w reflex microscopic -Urine, Clean Catch   Collection Time: 03/24/23  5:59 PM  Result Value Ref Range   Color, Urine STRAW (A) YELLOW   APPearance CLEAR (A) CLEAR   Specific Gravity, Urine 1.004 (L) 1.005 - 1.030   pH 6.0 5.0 - 8.0   Glucose, UA NEGATIVE NEGATIVE mg/dL   Hgb urine dipstick NEGATIVE NEGATIVE   Bilirubin Urine NEGATIVE NEGATIVE   Ketones, ur NEGATIVE NEGATIVE mg/dL   Protein, ur NEGATIVE NEGATIVE mg/dL   Nitrite NEGATIVE NEGATIVE   Leukocytes,Ua NEGATIVE NEGATIVE     Constitutional: NAD, AAOx3  HE/ENT: extraocular movements grossly intact, moist mucous membranes CV: RRR PULM: nl respiratory effort, CTABL Abd: gravid, non-tender, non-distended, soft  Ext: Non-tender, Nonedmeatous Psych: mood appropriate, speech normal Pelvic : moderate amount of physiologic discharge observed, no pooling observed and no vaginal bleeding observed upon speculum exam.  SVE: Dilation: 1 Effacement (%): 40 Station: -3 Presentation: Vertex Exam by:: Alissah Redmon, CNM 1/40/-3  NST: Baseline: 130 bpm Variability: moderate Accels: Present Decels: none Toco: q 1-2 mins pre bolus; post bolus contractions irregular   Category: I Interpretation:  INDICATIONS: r/o labor/uterine contractions  RESULTS:  A NST procedure was performed with FHR monitoring and a normal baseline established, appropriate time of 20-40 minutes of evaluation, and  accels >2 seen w 15x15 characteristics.  Results show a REACTIVE NST.   Assessment: 23 y.o. [redacted]w[redacted]d here for antenatal surveillance during pregnancy.  Principle diagnosis: Uterine Contractions  Plan:  Uterine Contractions/R/O Labor Labor: Not present. [place] and toco placed on abdomen. No tenderness or pain elicited upon palpation of abdomen. Mild contractions palpated. Patient states contractions have went from feeling strong to alternating between mild/ moderate. FHR and contractions observed via toco for approximately 3 hours and 28 minutes. Cervical exam performed and cervix was:1/40/-3. Cervical exam 1/40/-3, unchanged approximately 2 hours later indicating no cervical change reassuring against preterm labor.  Fetal Wellbeing: Reassuring Cat 1 tracing. Reactive NST LR Bolus 1,000 mL given. Contracting 1-2 minutes prior to LR bolus. Post LR bolus contractions were irregular.  Wet prep, GC/C, UA, ROM Plus, GBS collected today (03/24/2023)   - Wet prep results as follows: WNL   - UA results as follows: WNL   - ROM PLUS results as follows: Negative   - Group B strep results as follows: Positive    - GC/C results as follows: Negative Growth [place] completed with KC on 03/12/2023 @ [redacted]w[redacted]d; Results: WNL   - EFW: 2, 351g, 47%, 5 lbs 3 oz   -  AFI: Normal, 18.3 cm, MVP: 7.0 cm   - 3 vessel cord   - Presentation: Cephalic   - Placenta: Anterior  2. Back pain Tylenol 1,000 mg given.   - Patient advised if symptoms worsen to call to schedule an appointment with [facility]    Monday morning. Patient has routine prenatal visit scheduled on 03/27/2023.  - D/c home stable, strict return precautions reviewed, follow-up as needed.   ----- Roney Jaffe, CNM Certified Nurse Midwife [facility]

## 2023-03-24 NOTE — OB Triage Note (Signed)
 Pt reports ctx starting around 1500-1600 today. States they are irregular, happening around every 8 minutes. Denies vaginal bleeding. Does report LOF. Unable to tell the color of fluid she was having. + fetal movement. Elaina Hoops

## 2023-03-24 NOTE — Progress Notes (Signed)
 Discharge instructions provided to patient. Patient verbalized understanding. Pt educated on signs and symptoms of labor, vaginal bleeding, LOF, and fetal movement. Red flag signs reviewed by RN. Patient discharged home with her mother in stable condition.

## 2023-03-31 ENCOUNTER — Other Ambulatory Visit: Payer: Self-pay

## 2023-03-31 ENCOUNTER — Encounter: Payer: Self-pay | Admitting: Obstetrics and Gynecology

## 2023-03-31 ENCOUNTER — Observation Stay
Admission: EM | Admit: 2023-03-31 | Discharge: 2023-03-31 | Disposition: A | Discharge status: Home / Self Care | Attending: Obstetrics and Gynecology | Admitting: Obstetrics and Gynecology

## 2023-03-31 DIAGNOSIS — Z369 Encounter for antenatal screening, unspecified: Secondary | ICD-10-CM | POA: Diagnosis not present

## 2023-03-31 DIAGNOSIS — Z3A36 36 weeks gestation of pregnancy: Secondary | ICD-10-CM | POA: Insufficient documentation

## 2023-03-31 DIAGNOSIS — Z87891 Personal history of nicotine dependence: Secondary | ICD-10-CM | POA: Insufficient documentation

## 2023-03-31 DIAGNOSIS — O4290 Premature rupture of membranes, unspecified as to length of time between rupture and onset of labor, unspecified weeks of gestation: Secondary | ICD-10-CM | POA: Diagnosis not present

## 2023-03-31 DIAGNOSIS — O4193X0 Disorder of amniotic fluid and membranes, unspecified, third trimester, not applicable or unspecified: Secondary | ICD-10-CM | POA: Diagnosis present

## 2023-03-31 DIAGNOSIS — O42913 Preterm premature rupture of membranes, unspecified as to length of time between rupture and onset of labor, third trimester: Principal | ICD-10-CM | POA: Diagnosis present

## 2023-03-31 LAB — URINALYSIS, COMPLETE (UACMP) WITH MICROSCOPIC
Bacteria, UA: NONE SEEN
Bilirubin Urine: NEGATIVE
Glucose, UA: NEGATIVE mg/dL
Hgb urine dipstick: NEGATIVE
Ketones, ur: NEGATIVE mg/dL
Leukocytes,Ua: NEGATIVE
Nitrite: NEGATIVE
Protein, ur: NEGATIVE mg/dL
RBC / HPF: 0 RBC/hpf (ref 0–5)
Specific Gravity, Urine: 1.001 — ABNORMAL LOW (ref 1.005–1.030)
pH: 6 (ref 5.0–8.0)

## 2023-03-31 LAB — WET PREP, GENITAL
Clue Cells Wet Prep HPF POC: NONE SEEN
Sperm: NONE SEEN
Trich, Wet Prep: NONE SEEN
WBC, Wet Prep HPF POC: <10 — AB (ref <10)
Yeast Wet Prep HPF POC: NONE SEEN

## 2023-03-31 LAB — RUPTURE OF MEMBRANE (ROM)PLUS: Rom Plus: NEGATIVE

## 2023-03-31 MED ORDER — CALCIUM CARBONATE ANTACID 500 MG PO CHEW: 2.0000 | CHEWABLE_TABLET | ORAL | Status: DC | PRN

## 2023-03-31 NOTE — Discharge Summary (Signed)
 Destiny Mckenzie is a 23 y.o. female. She is at [redacted]w[redacted]d gestation. No LMP recorded. Patient is pregnant. Estimated Date of Delivery: 04/23/23  Prenatal care site: [facility]  Chief complaint: Leakage of amniotic fluid   HPI: Destiny Mckenzie presents to L&D with concerns of leakage of amniotic fluid. She reports that her "water broke" approximately around 0200 today (03/31/2023) requiring her to have to change her undergarments and her shorts. She states, "it soaked right through my clothes." She describes the fluid color as clear and states it was a moderate amount. Denies vaginal bleeding and contractions. Reports active fetal movement.   Factors complicating pregnancy: History of cholestasis of pregnancy Iron deficiency Vitamin B12 deficiency Vitamin D deficiency GBS positive   S: Resting comfortably. Denies feeling contractions, no vaginal bleeding, and no leakage of fluid. Endorses active fetal movement.   Maternal Medical History:  Past Medical Hx:  has a past medical history of Allergy, Allergy-induced asthma (10/09/2022), Encounter for supervision of normal pregnancy (04/21/2020), Fracture of ankle, left, closed (2012 and 2013), Headache(784.0), Mononucleosis (01/24/2012), Ovarian cyst, and Seasonal allergies.    Past Surgical Hx:  has a past surgical history that includes Tonsillectomy; Tonsillectomy (01/30/2012); laparoscopic appendectomy (N/A, 10/07/2013); Appendectomy; and Wisdom tooth extraction.   Allergies  Allergen Reactions   Iodine Rash   Sulfa Antibiotics Rash   Tramadol Other (See Comments)    "Mean"   Bactrim [Sulfamethoxazole-Trimethoprim] Rash   Contrast Media [Iodinated Contrast Media] Rash   Fleet Enema [Enema] Rash   Hydroxyzine Rash   Polyethylene Glycol Rash   Sodium Phosphates Rash     Prior to Admission medications   Medication Sig Start Date End Date Taking? Authorizing Provider  docusate sodium (COLACE) 100 MG capsule Take 1 capsule (100 mg  total) by mouth daily. 03/01/23   Janyce Llanos, CNM  Ferrous Sulfate (IRON PO) Take 1 tablet by mouth daily. 10/14/20  Yes [provider]  Prenatal Vit-Fe Fumarate-FA (PRENATAL MULTIVITAMIN) TABS tablet Take 1 tablet by mouth daily at 12 noon.   Yes [provider]    Social History: She  reports that she quit smoking about 7 years ago. Her smoking use included cigarettes. She has never used smokeless tobacco. She reports that she does not currently use alcohol. She reports that she does not use drugs.  Family History: family history includes Cancer (age of onset: 36) in her father; Cancer (age of onset: 61) in her maternal grandmother; Diabetes in her maternal grandfather and paternal grandfather; Healthy in her paternal grandmother; Heart Problems in her maternal grandfather; Hypertension in her maternal grandfather and paternal grandfather; Migraines in her sister; Obesity in her mother; Polycystic ovary syndrome in her mother. No history of gyn cancers.  Review of Systems:  Review of Systems  Constitutional: Negative.   Respiratory: Negative.    Cardiovascular: Negative.   Gastrointestinal: Negative.   Genitourinary: Negative.   Musculoskeletal: Negative.   Neurological: Negative.   Psychiatric/Behavioral: Negative.      O:  BP 119/68   Pulse 94   Temp 98.5 F (36.9 C) (Oral)  Results for orders placed or performed during the hospital encounter of 03/31/23 (from the past 48 hours)  Wet prep, genital   Collection Time: 03/31/23  3:24 AM  Result Value Ref Range   Yeast Wet Prep HPF POC NONE SEEN NONE SEEN   Trich, Wet Prep NONE SEEN NONE SEEN   Clue Cells Wet Prep HPF POC NONE SEEN NONE SEEN   WBC, Wet  Prep HPF POC <10 (A) <10   Sperm NONE SEEN   Urinalysis, Complete w Microscopic -Urine, Clean Catch   Collection Time: 03/31/23  3:24 AM  Result Value Ref Range   Color, Urine COLORLESS (A) YELLOW   APPearance CLEAR (A) CLEAR   Specific Gravity, Urine  1.001 (L) 1.005 - 1.030   pH 6.0 5.0 - 8.0   Glucose, UA NEGATIVE NEGATIVE mg/dL   Hgb urine dipstick NEGATIVE NEGATIVE   Bilirubin Urine NEGATIVE NEGATIVE   Ketones, ur NEGATIVE NEGATIVE mg/dL   Protein, ur NEGATIVE NEGATIVE mg/dL   Nitrite NEGATIVE NEGATIVE   Leukocytes,Ua NEGATIVE NEGATIVE   RBC / HPF 0 0 - 5 RBC/hpf   WBC, UA 0-5 0 - 5 WBC/hpf   Bacteria, UA NONE SEEN NONE SEEN   Squamous Epithelial / HPF 0-5 0 - 5 /HPF  Rupture of Membrane (ROM) Plus   Collection Time: 03/31/23  3:24 AM  Result Value Ref Range   Rom Plus NEGATIVE      Constitutional: NAD, AAOx3  HE/ENT: extraocular movements grossly intact, moist mucous membranes CV: RRR PULM: nl respiratory effort, CTABL Abd: gravid, non-tender, non-distended, soft  Ext: Non-tender, Nonedmeatous Psych: mood appropriate, speech normal Pelvic : moderate amount of normal, white/yellow physiologic discharge noted, no vaginal bleeding observed, and no pooling noted during speculum exam  SVE:   1/40/-3  NST: Baseline: 120 bpm Variability: moderate Accels: Present Decels: none Toco: regular, every 3-10 minutes Category: I Interpretation:  INDICATIONS: patient reassurance/rule out PPROM RESULTS:  A NST procedure was performed with FHR monitoring and a normal baseline established, appropriate time of 20-40 minutes of evaluation, and accels >2 seen w 15x15 characteristics.  Results show a REACTIVE NST.   Assessment: 23 y.o. [redacted]w[redacted]d here for antenatal surveillance during pregnancy.  Principle diagnosis: Leakage of amniotic fluid    Plan: Antenatal surveillance Labor: Not present. [place] and toco placed on abdomen. Abdomen soft and nontender to palpation. FHR tracing observed for approximately 1 hour. Contractions q3-10 minutes observed. Patient denies feeling contractions and no pooling of fluid noted during speculum exam reassuring against PPROM and labor. SVE: 1/40/-3.  Fetal Wellbeing: Reassuring Cat 1 tracing. Reactive NST    2. Rule Out PPROM Wet prep collected (03/31/2023); Results: Negative ROM Plus collected (03/31/2023); Results: Negative Urinalysis collected (03/31/2023); Results: WNL Education of signs/symptoms of labor provided in AVS.   - Education on GBS prophylaxis discussed with patient. All questions/concerns    answered.  - D/c home stable, strict return precautions reviewed, follow-up as needed.   ----- Roney Jaffe, CNM Certified Nurse Midwife [facility]

## 2023-03-31 NOTE — OB Triage Note (Signed)
 Patient is seen at [facility] was last seen a couple days ago for CTX. Patient is G2P1 [redacted]w[redacted]d today c/o LOF at 2am states it was clear and moderate amount. Pt had some nausea a day ago, denies s/s for UTI, denies PIH s/s. States some CTX throughout the week. VSS. Provider notified.

## 2023-03-31 NOTE — Progress Notes (Signed)
 Discharge instructions provided to patient. Patient verbalized understanding. Pt educated on signs and symptoms of labor, vaginal bleeding, LOF, and fetal movement. Red flag signs reviewed by RN. Patient discharged home with significant other and mother in stable condition.

## 2023-04-06 ENCOUNTER — Other Ambulatory Visit: Payer: Self-pay | Admitting: Obstetrics and Gynecology

## 2023-04-06 DIAGNOSIS — O26643 Intrahepatic cholestasis of pregnancy, third trimester: Secondary | ICD-10-CM

## 2023-04-07 ENCOUNTER — Other Ambulatory Visit: Payer: Self-pay

## 2023-04-07 ENCOUNTER — Encounter: Payer: Self-pay | Admitting: Obstetrics and Gynecology

## 2023-04-07 ENCOUNTER — Inpatient Hospital Stay: Admitting: Anesthesiology

## 2023-04-07 ENCOUNTER — Inpatient Hospital Stay
Admission: EM | Admit: 2023-04-07 | Discharge: 2023-04-09 | DRG: 806 | Disposition: A | Discharge status: Home / Self Care | Attending: Obstetrics | Admitting: Obstetrics

## 2023-04-07 DIAGNOSIS — Z3480 Encounter for supervision of other normal pregnancy, unspecified trimester: Principal | ICD-10-CM

## 2023-04-07 DIAGNOSIS — N83202 Unspecified ovarian cyst, left side: Secondary | ICD-10-CM

## 2023-04-07 DIAGNOSIS — D509 Iron deficiency anemia, unspecified: Secondary | ICD-10-CM | POA: Diagnosis present

## 2023-04-07 DIAGNOSIS — J45909 Unspecified asthma, uncomplicated: Secondary | ICD-10-CM

## 2023-04-07 DIAGNOSIS — O4290 Premature rupture of membranes, unspecified as to length of time between rupture and onset of labor, unspecified weeks of gestation: Secondary | ICD-10-CM

## 2023-04-07 DIAGNOSIS — K7689 Other specified diseases of liver: Secondary | ICD-10-CM | POA: Diagnosis present

## 2023-04-07 DIAGNOSIS — E559 Vitamin D deficiency, unspecified: Secondary | ICD-10-CM | POA: Diagnosis present

## 2023-04-07 DIAGNOSIS — O26893 Other specified pregnancy related conditions, third trimester: Secondary | ICD-10-CM | POA: Diagnosis present

## 2023-04-07 DIAGNOSIS — E7879 Other disorders of bile acid and cholesterol metabolism: Secondary | ICD-10-CM | POA: Diagnosis present

## 2023-04-07 DIAGNOSIS — O26649 Intrahepatic cholestasis of pregnancy, unspecified trimester: Secondary | ICD-10-CM | POA: Diagnosis present

## 2023-04-07 DIAGNOSIS — L299 Pruritus, unspecified: Secondary | ICD-10-CM | POA: Diagnosis present

## 2023-04-07 DIAGNOSIS — D62 Acute posthemorrhagic anemia: Secondary | ICD-10-CM | POA: Diagnosis not present

## 2023-04-07 DIAGNOSIS — Z87891 Personal history of nicotine dependence: Secondary | ICD-10-CM

## 2023-04-07 DIAGNOSIS — B951 Streptococcus, group B, as the cause of diseases classified elsewhere: Secondary | ICD-10-CM | POA: Diagnosis present

## 2023-04-07 DIAGNOSIS — Z833 Family history of diabetes mellitus: Secondary | ICD-10-CM | POA: Diagnosis not present

## 2023-04-07 DIAGNOSIS — O99824 Streptococcus B carrier state complicating childbirth: Secondary | ICD-10-CM | POA: Diagnosis present

## 2023-04-07 DIAGNOSIS — E538 Deficiency of other specified B group vitamins: Secondary | ICD-10-CM | POA: Diagnosis present

## 2023-04-07 DIAGNOSIS — Z8249 Family history of ischemic heart disease and other diseases of the circulatory system: Secondary | ICD-10-CM | POA: Diagnosis not present

## 2023-04-07 DIAGNOSIS — O9081 Anemia of the puerperium: Secondary | ICD-10-CM | POA: Diagnosis not present

## 2023-04-07 DIAGNOSIS — O99284 Endocrine, nutritional and metabolic diseases complicating childbirth: Secondary | ICD-10-CM | POA: Diagnosis present

## 2023-04-07 DIAGNOSIS — Z8719 Personal history of other diseases of the digestive system: Secondary | ICD-10-CM

## 2023-04-07 DIAGNOSIS — O26643 Intrahepatic cholestasis of pregnancy, third trimester: Principal | ICD-10-CM | POA: Diagnosis present

## 2023-04-07 DIAGNOSIS — Z3A37 37 weeks gestation of pregnancy: Secondary | ICD-10-CM | POA: Diagnosis not present

## 2023-04-07 DIAGNOSIS — O99891 Other specified diseases and conditions complicating pregnancy: Secondary | ICD-10-CM

## 2023-04-07 DIAGNOSIS — M549 Dorsalgia, unspecified: Secondary | ICD-10-CM

## 2023-04-07 LAB — RPR: RPR Ser Ql: NONREACTIVE

## 2023-04-07 LAB — CBC
HCT: 32.7 % — ABNORMAL LOW (ref 36.0–46.0)
Hemoglobin: 11.1 g/dL — ABNORMAL LOW (ref 12.0–15.0)
MCH: 30.1 pg (ref 26.0–34.0)
MCHC: 33.9 g/dL (ref 30.0–36.0)
MCV: 88.6 fL (ref 80.0–100.0)
Platelets: 344 10*3/uL (ref 150–400)
RBC: 3.69 MIL/uL — ABNORMAL LOW (ref 3.87–5.11)
RDW: 12.3 % (ref 11.5–15.5)
WBC: 12.4 10*3/uL — ABNORMAL HIGH (ref 4.0–10.5)
nRBC: 0 % (ref 0.0–0.2)

## 2023-04-07 LAB — COMPREHENSIVE METABOLIC PANEL
ALT: 56 U/L — ABNORMAL HIGH (ref 0–44)
AST: 37 U/L (ref 15–41)
Albumin: 3 g/dL — ABNORMAL LOW (ref 3.5–5.0)
Alkaline Phosphatase: 179 U/L — ABNORMAL HIGH (ref 38–126)
Anion gap: 7 (ref 5–15)
BUN: 6 mg/dL (ref 6–20)
CO2: 20 mmol/L — ABNORMAL LOW (ref 22–32)
Calcium: 8.9 mg/dL (ref 8.9–10.3)
Chloride: 108 mmol/L (ref 98–111)
Creatinine, Ser: 0.66 mg/dL (ref 0.44–1.00)
GFR, Estimated: >60 mL/min (ref >60)
Glucose, Bld: 84 mg/dL (ref 70–99)
Potassium: 3.5 mmol/L (ref 3.5–5.1)
Sodium: 135 mmol/L (ref 135–145)
Total Bilirubin: 0.8 mg/dL (ref 0.0–1.2)
Total Protein: 6.9 g/dL (ref 6.5–8.1)

## 2023-04-07 LAB — TYPE AND SCREEN
ABO/RH(D): O POS
Antibody Screen: NEGATIVE

## 2023-04-07 MED ORDER — MISOPROSTOL 200 MCG PO TABS
ORAL_TABLET | ORAL | Status: AC
  Filled 2023-04-07: qty 4

## 2023-04-07 MED ORDER — LIDOCAINE-EPINEPHRINE (PF) 1.5 %-1:200000 IJ SOLN
INTRAMUSCULAR | Status: DC | PRN
  Administered 2023-04-07: 3 mL via EPIDURAL

## 2023-04-07 MED ORDER — ONDANSETRON HCL 4 MG/2ML IJ SOLN: 4.0000 mg | INTRAMUSCULAR | Status: DC | PRN

## 2023-04-07 MED ORDER — MISOPROSTOL 25 MCG QUARTER TABLET
25.0000 ug | ORAL_TABLET | ORAL | Status: DC
  Administered 2023-04-07: 25 ug via VAGINAL
  Filled 2023-04-07 (×2): qty 1

## 2023-04-07 MED ORDER — PENICILLIN G POT IN DEXTROSE 60000 UNIT/ML IV SOLN
3.0000 10*6.[IU] | INTRAVENOUS | Status: DC
  Administered 2023-04-07 (×3): 3 10*6.[IU] via INTRAVENOUS
  Filled 2023-04-07 (×3): qty 50

## 2023-04-07 MED ORDER — OXYTOCIN-SODIUM CHLORIDE 30-0.9 UT/500ML-% IV SOLN
1.0000 m[IU]/min | INTRAVENOUS | Status: DC
  Administered 2023-04-07: 2 m[IU]/min via INTRAVENOUS

## 2023-04-07 MED ORDER — FENTANYL CITRATE (PF) 100 MCG/2ML IJ SOLN: 50.0000 ug | INTRAMUSCULAR | Status: DC | PRN

## 2023-04-07 MED ORDER — FENTANYL-BUPIVACAINE-NACL 0.5-0.125-0.9 MG/250ML-% EP SOLN
12.0000 mL/h | EPIDURAL | Status: DC | PRN
  Administered 2023-04-07: 12 mL/h via EPIDURAL

## 2023-04-07 MED ORDER — OXYCODONE-ACETAMINOPHEN 5-325 MG PO TABS: 2.0000 | ORAL_TABLET | ORAL | Status: DC | PRN

## 2023-04-07 MED ORDER — SENNOSIDES-DOCUSATE SODIUM 8.6-50 MG PO TABS
2.0000 | ORAL_TABLET | ORAL | Status: DC
  Administered 2023-04-07 – 2023-04-08 (×2): 2 via ORAL
  Filled 2023-04-07 (×3): qty 2

## 2023-04-07 MED ORDER — PHENYLEPHRINE 80 MCG/ML (10ML) SYRINGE FOR IV PUSH (FOR BLOOD PRESSURE SUPPORT): 80.0000 ug | PREFILLED_SYRINGE | INTRAVENOUS | Status: DC | PRN

## 2023-04-07 MED ORDER — PRENATAL MULTIVITAMIN CH
1.0000 | ORAL_TABLET | Freq: Every day | ORAL | Status: DC
  Administered 2023-04-08 – 2023-04-09 (×2): 1 via ORAL
  Filled 2023-04-07 (×2): qty 1

## 2023-04-07 MED ORDER — DIBUCAINE (PERIANAL) 1 % EX OINT: 1.0000 | TOPICAL_OINTMENT | CUTANEOUS | Status: DC | PRN

## 2023-04-07 MED ORDER — MISOPROSTOL 25 MCG QUARTER TABLET
25.0000 ug | ORAL_TABLET | ORAL | Status: DC
  Administered 2023-04-07: 25 ug via ORAL
  Filled 2023-04-07: qty 1

## 2023-04-07 MED ORDER — DIPHENHYDRAMINE HCL 50 MG/ML IJ SOLN: 12.5000 mg | INTRAMUSCULAR | Status: DC | PRN

## 2023-04-07 MED ORDER — BISACODYL 10 MG RE SUPP: 10.0000 mg | Freq: Every day | RECTAL | Status: DC | PRN

## 2023-04-07 MED ORDER — LIDOCAINE HCL (PF) 1 % IJ SOLN
30.0000 mL | INTRAMUSCULAR | Status: DC | PRN
  Filled 2023-04-07: qty 30

## 2023-04-07 MED ORDER — FENTANYL-BUPIVACAINE-NACL 0.5-0.125-0.9 MG/250ML-% EP SOLN
EPIDURAL | Status: AC
  Filled 2023-04-07: qty 250

## 2023-04-07 MED ORDER — COCONUT OIL OIL: 1.0000 | TOPICAL_OIL | Status: DC | PRN

## 2023-04-07 MED ORDER — TERBUTALINE SULFATE 1 MG/ML IJ SOLN: 0.2500 mg | Freq: Once | INTRAMUSCULAR | Status: DC | PRN

## 2023-04-07 MED ORDER — OXYTOCIN 10 UNIT/ML IJ SOLN
INTRAMUSCULAR | Status: AC
  Filled 2023-04-07: qty 2

## 2023-04-07 MED ORDER — FLEET ENEMA RE ENEM: 1.0000 | ENEMA | Freq: Every day | RECTAL | Status: DC | PRN

## 2023-04-07 MED ORDER — BENZOCAINE-MENTHOL 20-0.5 % EX AERO
1.0000 | INHALATION_SPRAY | CUTANEOUS | Status: DC | PRN
  Administered 2023-04-07: 1 via TOPICAL
  Filled 2023-04-07 (×2): qty 56

## 2023-04-07 MED ORDER — OXYCODONE-ACETAMINOPHEN 5-325 MG PO TABS: 1.0000 | ORAL_TABLET | ORAL | Status: DC | PRN

## 2023-04-07 MED ORDER — ONDANSETRON HCL 4 MG/2ML IJ SOLN: 4.0000 mg | Freq: Four times a day (QID) | INTRAMUSCULAR | Status: DC | PRN

## 2023-04-07 MED ORDER — EPHEDRINE 5 MG/ML INJ: 10.0000 mg | INTRAVENOUS | Status: DC | PRN

## 2023-04-07 MED ORDER — AMMONIA AROMATIC IN INHA
RESPIRATORY_TRACT | Status: AC
  Filled 2023-04-07: qty 10

## 2023-04-07 MED ORDER — LACTATED RINGERS IV SOLN: INTRAVENOUS | Status: DC

## 2023-04-07 MED ORDER — WITCH HAZEL-GLYCERIN EX PADS
1.0000 | MEDICATED_PAD | CUTANEOUS | Status: DC | PRN
  Filled 2023-04-07: qty 100

## 2023-04-07 MED ORDER — LIDOCAINE HCL (PF) 1 % IJ SOLN
INTRAMUSCULAR | Status: DC | PRN
  Administered 2023-04-07: 1 mL via SUBCUTANEOUS

## 2023-04-07 MED ORDER — SODIUM CHLORIDE 0.9 % IV SOLN
5.0000 10*6.[IU] | Freq: Once | INTRAVENOUS | Status: AC
  Administered 2023-04-07: 5 10*6.[IU] via INTRAVENOUS
  Filled 2023-04-07: qty 5

## 2023-04-07 MED ORDER — OXYCODONE HCL 5 MG PO TABS: 5.0000 mg | ORAL_TABLET | ORAL | Status: DC | PRN

## 2023-04-07 MED ORDER — ACETAMINOPHEN 325 MG PO TABS
650.0000 mg | ORAL_TABLET | ORAL | Status: DC | PRN
  Administered 2023-04-07 – 2023-04-09 (×10): 650 mg via ORAL
  Filled 2023-04-07 (×10): qty 2

## 2023-04-07 MED ORDER — OXYTOCIN-SODIUM CHLORIDE 30-0.9 UT/500ML-% IV SOLN
2.5000 [IU]/h | INTRAVENOUS | Status: DC
  Filled 2023-04-07: qty 500

## 2023-04-07 MED ORDER — ONDANSETRON HCL 4 MG PO TABS: 4.0000 mg | ORAL_TABLET | ORAL | Status: DC | PRN

## 2023-04-07 MED ORDER — NALBUPHINE HCL 10 MG/ML IJ SOLN
10.0000 mg | INTRAMUSCULAR | Status: DC | PRN
  Administered 2023-04-07: 10 mg via INTRAVENOUS
  Filled 2023-04-07 (×2): qty 1

## 2023-04-07 MED ORDER — LACTATED RINGERS IV SOLN: 500.0000 mL | INTRAVENOUS | Status: DC | PRN

## 2023-04-07 MED ORDER — IBUPROFEN 600 MG PO TABS
600.0000 mg | ORAL_TABLET | Freq: Four times a day (QID) | ORAL | Status: DC
  Administered 2023-04-07 – 2023-04-09 (×8): 600 mg via ORAL
  Filled 2023-04-07 (×8): qty 1

## 2023-04-07 MED ORDER — SIMETHICONE 80 MG PO CHEW: 80.0000 mg | CHEWABLE_TABLET | ORAL | Status: DC | PRN

## 2023-04-07 MED ORDER — ACETAMINOPHEN 325 MG PO TABS: 650.0000 mg | ORAL_TABLET | ORAL | Status: DC | PRN

## 2023-04-07 MED ORDER — DIPHENHYDRAMINE HCL 25 MG PO CAPS: 25.0000 mg | ORAL_CAPSULE | Freq: Four times a day (QID) | ORAL | Status: DC | PRN

## 2023-04-07 MED ORDER — ZOLPIDEM TARTRATE 5 MG PO TABS: 5.0000 mg | ORAL_TABLET | Freq: Every evening | ORAL | Status: DC | PRN

## 2023-04-07 MED ORDER — OXYTOCIN BOLUS FROM INFUSION
333.0000 mL | Freq: Once | INTRAVENOUS | Status: AC
Start: 2023-04-07 — End: 2023-04-07
  Administered 2023-04-07: 333 mL via INTRAVENOUS

## 2023-04-07 MED ORDER — LACTATED RINGERS IV SOLN
500.0000 mL | Freq: Once | INTRAVENOUS | Status: AC
  Administered 2023-04-07: 500 mL via INTRAVENOUS

## 2023-04-07 NOTE — Progress Notes (Signed)
 L&D Note    Subjective:  Still feeling contractions but they are tolerable  Objective:   Vitals:   04/07/23 1450 04/07/23 1455 04/07/23 1500 04/07/23 1501  BP:    116/74  Pulse:    73  Resp:      Temp:      TempSrc:      SpO2: 99% 96% 100%   Weight:      Height:        Current Vital Signs 24h Vital Sign Ranges  T 98 F (36.7 C) Temp  Avg: 98 F (36.7 C)  Min: 97.7 F (36.5 C)  Max: 98.2 F (36.8 C)  BP 116/74 BP  Min: 80/50  Max: 116/74  HR 73 Pulse  Avg: 83.4  Min: 65  Max: 109  RR 16 Resp  Avg: 16.4  Min: 16  Max: 18  SaO2 100 %   SpO2  Avg: 98 %  Min: 96 %  Max: 100 %      Gen: alert, cooperative, no distress FHR: Baseline: 145 bpm, Variability: moderate, Accels: Present, Decels: variable intermittently Toco: regular, every 2-4 minutes SVE: Dilation: 5 Effacement (%): 80, 90 Cervical Position: Middle, Anterior Station: -1 Presentation: Vertex Exam by:: Rubye Oaks  Medications SCHEDULED MEDICATIONS   misoprostol  25 mcg Oral Q4H   And   misoprostol  25 mcg Vaginal Q4H   oxytocin 40 units in LR 1000 mL  333 mL Intravenous Once    MEDICATION INFUSIONS   fentaNYL 2 mcg/mL w/bupivacaine 0.125% in NS 250 mL 12 mL/hr (04/07/23 1218)   lactated ringers     lactated ringers 125 mL/hr at 04/07/23 1242   oxytocin     oxytocin 14 milli-units/min (04/07/23 1535)   pencillin G potassium IV 3 Million Units (04/07/23 1115)    PRN MEDICATIONS  acetaminophen, diphenhydrAMINE, ePHEDrine, ePHEDrine, fentaNYL (SUBLIMAZE) injection, fentaNYL 2 mcg/mL w/bupivacaine 0.125% in NS 250 mL, lactated ringers, lidocaine (PF), ondansetron, oxyCODONE-acetaminophen, oxyCODONE-acetaminophen, phenylephrine, phenylephrine, terbutaline   Assessment & Plan:  23 y.o. G2P1001 at [redacted]w[redacted]d admitted for Labor_induction_indication: cholestasis  -GBS: positive -IP Antibiotics: abx: PCN -Membranes ruptured, clear fluid -Recheck:Evaluated by sterile speculum exam. -Preeclampsia:  labs stable -Pain:  receiving treatment -Intervention: IV Pitocin augmentation, change maternal position, and anticipate vaginal delivery -Pe_uterus_labor: Pitocin at 14 mu/min. and Adequate relaxation between contractions. -Analgesia: regional anesthesia   Chari Manning, CNM  04/07/2023 3:41 PM  [facility]

## 2023-04-07 NOTE — Discharge Summary (Shared)
 Postpartum Discharge Summary  Patient Name: Destiny Mckenzie DOB: December 25, 2000 MRN: [id number]  Date of admission: 04/07/2023 Delivery date:04/07/2023 Delivering provider: Chari Manning Date of discharge: 04/09/2023  Primary OB: [facility] LMP:No LMP recorded (lmp unknown). EDC Estimated Date of Delivery: 04/23/23 Gestational Age at Delivery: [redacted]w[redacted]d   Admitting diagnosis: Cholestasis of pregnancy [O26.649] Intrauterine pregnancy: [redacted]w[redacted]d     Secondary diagnosis:   Principal Problem:   NSVD (normal spontaneous vaginal delivery) Active Problems:   History of cholestasis during pregnancy   Vitamin B12 deficiency   Vitamin D deficiency   Cholestasis of pregnancy   Iron deficiency anemia during pregnancy   Positive GBS test   Discharge Diagnosis: Term Pregnancy Delivered      Hospital course: Induction of Labor With Vaginal Delivery   23 y.o. yo [id number] at [redacted]w[redacted]d was admitted to the hospital 04/07/2023 for induction of labor.  Indication for induction: Cholestasis of pregnancy.  Patient had an  uncomplicated labor course  Membrane Rupture Time/Date:  ,04/07/2023  Delivery Method:Vaginal, Spontaneous Operative Delivery:N/A Episiotomy: None Lacerations:  None Details of delivery can be found in separate delivery note.  Patient had a postpartum course complicated by none. Patient is discharged home 04/09/23.  Newborn Data: Birth date:04/07/2023 Birth time:4:38 PM Gender:Female Living status:Living Apgars:8 ,9  Weight:2980 g                                            Post partum procedures: none Induction:: AROM, Pitocin, and Cytotec Complications: None Delivery Type: spontaneous vaginal delivery Anesthesia: IV narcotics, epidural anesthesia Placenta: spontaneous To Pathology: No   Prenatal Labs:   Blood type/Rh O POS   Antibody screen Negative    Rubella 2.00 (09/16 1535)   Varicella Immune  RPR Non Reactive (09/16 1535)   HBsAg Negative (09/16 1535)  Hep C NR    HIV Non Reactive (09/16 1535)   GC neg  Chlamydia neg  Genetic screening cfDNA negative   1 hour GTT 133  3 hour GTT N/A  GBS POSITIVE/-- (03/01 1758)     Magnesium Sulfate received: No BMZ received: No Rhophylac:was not indicated MMR: was not indicated Varivax vaccine given: was offered and declined by the patient - Tdap vaccine:  declined - Flu vaccine:  declined -RSV vaccine:not in season  Transfusion:No  Physical exam  Vitals:   04/08/23 1126 04/08/23 1916 04/08/23 2305 04/09/23 0823  BP: 100/63 101/61 98/60 102/65  Pulse: 89 60 73 65  Resp: 16 18 18    Temp: 98.8 F (37.1 C) 97.9 F (36.6 C) 98.5 F (36.9 C) 98.2 F (36.8 C)  TempSrc:  Oral Oral Oral  SpO2: 98% 97% 97% 99%  Weight:      Height:       General: alert, cooperative, and no distress Lochia: appropriate Uterine Fundus: firm Perineum:minimal edema/intact DVT Evaluation: No evidence of DVT seen on physical exam.  Labs: Lab Results  Component Value Date   WBC 12.2 (H) 04/08/2023   HGB 10.4 (L) 04/08/2023   HCT 30.4 (L) 04/08/2023   MCV 88.1 04/08/2023   PLT 312 04/08/2023      Latest Ref Rng & Units 04/07/2023    7:18 AM  CMP  Glucose 70 - 99 mg/dL 84   BUN 6 - 20 mg/dL 6   Creatinine 1.30 - 8.65 mg/dL 7.84   Sodium 696 - 295 mmol/L 135  Potassium 3.5 - 5.1 mmol/L 3.5   Chloride 98 - 111 mmol/L 108   CO2 22 - 32 mmol/L 20   Calcium 8.9 - 10.3 mg/dL 8.9   Total Protein 6.5 - 8.1 g/dL 6.9   Total Bilirubin 0.0 - 1.2 mg/dL 0.8   Alkaline Phos 38 - 126 U/L 179   AST 15 - 41 U/L 37   ALT 0 - 44 U/L 56    Edinburgh Score:    04/08/2023    6:08 AM  Edinburgh Postnatal Depression Scale Screening Tool  I have been able to laugh and see the funny side of things. 0  I have looked forward with enjoyment to things. 0  I have blamed myself unnecessarily when things went wrong. 0  I have been anxious or worried for no good reason. 0  I have felt scared or panicky for no good reason. 0   Things have been getting on top of me. 0  I have been so unhappy that I have had difficulty sleeping. 0  I have felt sad or miserable. 0  I have been so unhappy that I have been crying. 0  The thought of harming myself has occurred to me. 0  Edinburgh Postnatal Depression Scale Total 0    Risk assessment for postpartum VTE and prophylactic treatment: Very high risk factors: None High risk factors: None Moderate risk factors: None  Postpartum VTE prophylaxis with LMWH not indicated  After visit meds:  Allergies as of 04/09/2023       Reactions   Iodine Rash   Sulfa Antibiotics Rash   Tramadol Other (See Comments)   "Mean"   Bactrim [sulfamethoxazole-trimethoprim] Rash   Contrast Media [iodinated Contrast Media] Rash   Fleet Enema [enema] Rash   Hydroxyzine Rash   Polyethylene Glycol Rash   Sodium Phosphates Rash        Medication List     TAKE these medications    cyanocobalamin 1000 MCG tablet Commonly known as: VITAMIN B12 Take 1,000 mcg by mouth daily.   docusate sodium 100 MG capsule Commonly known as: COLACE Take 1 capsule (100 mg total) by mouth daily.   ferrous sulfate 325 (65 FE) MG tablet Take 325 mg by mouth daily with breakfast.   prenatal multivitamin Tabs tablet Take 1 tablet by mouth daily at 12 noon.   Vitamin D-3 125 MCG (5000 UT) Tabs Take 5,000 Units by mouth daily.       Discharge home in stable condition Infant Feeding: Breast Infant Disposition:home with mother Discharge instruction: per After Visit Summary and Postpartum booklet. Activity: Advance as tolerated. Pelvic rest for 6 weeks.  Diet: routine diet Anticipated Birth Control:  Contraceptives: IUD Liletta Postpartum Appointment:6 weeks Additional Postpartum F/U:  none Future Appointments:No future appointments. Follow up Visit:  Follow-up Information     Chari Manning, CNM Follow up in 6 week(s).   Specialty: Obstetrics Why: postpartum and Liletta IUD  insertion Contact information: [address] [place] [address] [phone]                 Plan:  Destiny Mckenzie was discharged to home in good condition. Follow-up appointment as directed.    SignedCyril Mourning 04/09/2023 9:23 AM

## 2023-04-07 NOTE — H&P (Signed)
 OB History & Physical   History of Present Illness:   Chief Complaint: scheduled IOL   HPI:  Destiny Mckenzie is a 23 y.o. G26P1001 female at 100w5d, No LMP recorded (lmp unknown). Patient is pregnant., not consistent with [place] at [redacted]w[redacted]d, with Estimated Date of Delivery: 04/23/23.  She presents to L&D for scheduled induction of labor. Destiny Mckenzie presented with new onset symptoms of pruritus on hands and feet on. Bile acids were initially <0.1 but ALT was elevated to 65. Her symptoms became progressively worse over 2 days and repeat labs on 3/12 were bile acids 4.9 and ALT 53. Decision made to proceed with IOL d/t symptoms of cholestasis with a history of cholestasis in prior pregnancy. Her pregnancy is complicated by presumed cholestasis of pregnancy, transaminitis, iron deficiency anemia, vitamin B12 deficiency, and vitamin D deficiency .  She denies Contractions, Loss of fluid, or Vaginal bleeding prior to misoprostol administration. Endorses fetal movement as active.   Reports active fetal movement  Contractions: every 2 to 4 minutes since misoprostol given  LOF/SROM: denies  Vaginal bleeding: denies   Factors complicating pregnancy:  Principal Problem:   Cholestasis of pregnancy Active Problems:   History of cholestasis during pregnancy   Vitamin B12 deficiency   Vitamin D deficiency   Iron deficiency anemia during pregnancy   Positive GBS test    Prenatal care site:  [facility]  Patient Active Problem List   Diagnosis Date Noted   Cholestasis of pregnancy 04/07/2023   Iron deficiency anemia during pregnancy 04/07/2023   Positive GBS test 04/07/2023   Back pain affecting pregnancy 03/24/2023   Vitamin B12 deficiency 03/07/2023   Vitamin D deficiency 03/07/2023   Constipation during pregnancy in third trimester 03/01/2023   Encounter for supervision of other normal pregnancy, third trimester 12/05/2022   Allergy-induced asthma 10/09/2022   History of cholestasis during  pregnancy 11/20/2020    Prenatal Transfer Tool  Maternal Diabetes: No Genetic Screening: Normal Maternal Ultrasounds/Referrals: Normal Fetal Ultrasounds or other Referrals:  None Maternal Substance Abuse:  No Significant Maternal Medications:  None Significant Maternal Lab Results: Group B Strep positive  Maternal Medical History:   Past Medical History:  Diagnosis Date   Allergy    Allergy-induced asthma 10/09/2022   Encounter for supervision of normal pregnancy 04/21/2020   Formatting of this note is different from the original.  23 y.o. G1P0 at [redacted]w[redacted]d Patient's last menstrual period was 02/09/2020 (approximate). inconsistent with ultrasound @ [redacted]w[redacted]d. Estimated Date of Delivery: 12/10/20  Sex of baby and name:  " "   FOB:       Factors complicating this pregnancy   1. IOL scheduled for 11/20/20 at 0001 for Cholestasis   JO  2. Large simple ovarian cyst   5.1cmon 3/30    Fracture of ankle, left, closed 2012 and 2013   Headache(784.0)    Mononucleosis 01/24/2012   Ovarian cyst    Ovarian cyst, left 10/06/2022   Noted with first pregnancy, measuring 3.94 x 3.26 x 3.64 cm  at dating [place] with G2     Seasonal allergies     Past Surgical History:  Procedure Laterality Date   APPENDECTOMY     LAPAROSCOPIC APPENDECTOMY N/A 10/07/2013   Procedure: APPENDECTOMY LAPAROSCOPIC;  Surgeon: Dalia Heading, MD;  Location: [facility];  Service: General;  Laterality: N/A;   TONSILLECTOMY     TONSILLECTOMY  01/30/2012   WISDOM TOOTH EXTRACTION     three; age 4    Allergies  Allergen Reactions   Iodine Rash   Sulfa Antibiotics Rash   Tramadol Other (See Comments)    "Mean"   Bactrim [Sulfamethoxazole-Trimethoprim] Rash   Contrast Media [Iodinated Contrast Media] Rash   Fleet Enema [Enema] Rash   Hydroxyzine Rash   Polyethylene Glycol Rash   Sodium Phosphates Rash    Prior to Admission medications   Medication Sig Start Date End Date Taking? Authorizing Provider  docusate sodium  (COLACE) 100 MG capsule Take 1 capsule (100 mg total) by mouth daily. 03/01/23   Janyce Llanos, CNM  Ferrous Sulfate (IRON PO) Take 1 tablet by mouth daily. 10/14/20   [provider]  Prenatal Vit-Fe Fumarate-FA (PRENATAL MULTIVITAMIN) TABS tablet Take 1 tablet by mouth daily at 12 noon.    [provider]    OB History  Gravida Para Term Preterm AB Living  2 1 1  0 0 1  SAB IAB Ectopic Multiple Live Births  0 0 0 0 1    # Outcome Date GA Lbr Len/2nd Weight Sex Type Anes PTL Lv  2 Current           1 Term 11/20/20 [redacted]w[redacted]d 783:12 / 00:31 2640 g M Vag-Spont EPI  LIV     Name: Destiny Mckenzie,Destiny Mckenzie     Apgar1: 8  Apgar5: 9     Social History: She  reports that she quit smoking about 7 years ago. Her smoking use included cigarettes. She has never used smokeless tobacco. She reports that she does not currently use alcohol. She reports that she does not use drugs.  Family History: family history includes Cancer (age of onset: 76) in her father; Cancer (age of onset: 65) in her maternal grandmother; Diabetes in her maternal grandfather and paternal grandfather; Healthy in her paternal grandmother; Heart Problems in her maternal grandfather; Hypertension in her maternal grandfather and paternal grandfather; Migraines in her sister; Obesity in her mother; Polycystic ovary syndrome in her mother.   Review of Systems: A full review of systems was performed and negative except as noted in the HPI.     Physical Exam:  Vital Signs: BP 98/61   Pulse 75   Temp 98.2 F (36.8 C) (Oral)   Resp 18   Ht 5\' 3"  (1.6 m)   Wt 74.8 kg   LMP  (LMP Unknown)   BMI 29.23 kg/m   General: no acute distress.  HEENT: normocephalic, atraumatic Lungs: normal respiratory effort Abdomen: soft, gravid, non-tender;  EFW: 6 to 6 1/2 lbs  Pelvic:   External: Normal external female genitalia  Cervix: Dilation: 1.5 / Effacement (%): 50 / Station: -3    Extremities: non-tender, symmetric, no  edema bilaterally.  DTRs: 2+/2+  Neurologic: Alert & oriented x 3.    Results for orders placed or performed during the hospital encounter of 04/07/23 (from the past 24 hours)  CBC     Status: Abnormal   Collection Time: 04/07/23  1:30 AM  Result Value Ref Range   WBC 12.4 (H) 4.0 - 10.5 K/uL   RBC 3.69 (L) 3.87 - 5.11 MIL/uL   Hemoglobin 11.1 (L) 12.0 - 15.0 g/dL   HCT 78.2 (L) 95.6 - 21.3 %   MCV 88.6 80.0 - 100.0 fL   MCH 30.1 26.0 - 34.0 pg   MCHC 33.9 30.0 - 36.0 g/dL   RDW 08.6 57.8 - 46.9 %   Platelets 344 150 - 400 K/uL   nRBC 0.0 0.0 - 0.2 %  Type and screen  Status: None   Collection Time: 04/07/23  1:30 AM  Result Value Ref Range   ABO/RH(D) O POS    Antibody Screen NEG    Sample Expiration      04/10/2023,2359 Performed at [facility], [address] [place], [place] [address]     Pertinent Results:  Prenatal Labs: Blood type/Rh O POS   Antibody screen Negative    Rubella 2.00 (09/16 1535)   Varicella Immune  RPR Non Reactive (09/16 1535)   HBsAg Negative (09/16 1535)  Hep C NR   HIV Non Reactive (09/16 1535)   GC neg  Chlamydia neg  Genetic screening cfDNA negative   1 hour GTT 133  3 hour GTT N/A  GBS POSITIVE/-- (03/01 1758)    FHT:  FHR: 120 bpm, variability: moderate,  accelerations:  Present,  decelerations:  Absent Category/reactivity:  Category I UC:   regular, every 2-4 minutes   Cephalic by Leopolds and SVE   No results found.  Assessment:  Destiny Mckenzie is a 23 y.o. G66P1001 female at [redacted]w[redacted]d with IOL d/t presumed cholestasis of pregnancy.   Plan:  1. Admit to Labor & Delivery - Admission status: Inpatient - Dr Virgel Manifold MD notified of admission and plan of care  - Reason for admission: induction of labor - consents reviewed and obtained  2. Fetal Well being  - Fetal Tracing: cat 1 - Group B Streptococcus ppx indicated: GBS positive - Presentation: cephalic confirmed by SVE   3. Routine OB: - Prenatal labs  reviewed, as above - Rh positive - CBC, T&S, RPR on admit - Regular diet, saline lock  4. Monitoring of labor  - Contractions monitored with external toco - Pelvis proven to 5lbs 13 oz, adequate for trial of labor  - Plan for induction with misoprostol  - Induction with oxytocin and AROM as appropriate  - Plan for  continuous fetal monitoring - Maternal pain control as desired; planning regional anesthesia - Anticipate vaginal delivery  5. Post Partum Planning: - Infant feeding: breast feeding - Contraception: IUD - Liletta  - Flu vaccine:  declined  - Tdap vaccine:  declined  - RSV vaccine: Not in season   Gustavo Lah, [place] 04/07/23 7:45 AM  Margaretmary Eddy, CNM Certified Nurse Midwife [facility]

## 2023-04-07 NOTE — Anesthesia Procedure Notes (Signed)
 Epidural Patient location during procedure: OB Start time: 04/07/2023 12:09 PM End time: 04/07/2023 12:20 PM  Staffing Anesthesiologist: Foye Deer, MD Performed: anesthesiologist   Preanesthetic Checklist Completed: patient identified, IV checked, site marked, risks and benefits discussed, surgical consent, monitors and equipment checked, pre-op evaluation and timeout performed  Epidural Patient position: sitting Prep: ChloraPrep Patient monitoring: heart rate, continuous pulse ox and blood pressure Approach: midline Location: L3-L4 Injection technique: LOR saline  Needle:  Needle type: Tuohy  Needle gauge: 18 G Needle length: 9 cm Needle insertion depth: 6 cm Catheter type: closed end Catheter size: 20 Guage Catheter at skin depth: 10 cm Test dose: negative and 1.5% lidocaine with Epi 1:200 K  Assessment Events: blood not aspirated, no cerebrospinal fluid, injection not painful, no injection resistance and no paresthesia  Additional Notes Reason for block:procedure for pain

## 2023-04-07 NOTE — Anesthesia Preprocedure Evaluation (Signed)
 Anesthesia Evaluation  Patient identified by MRN, date of birth, ID band Patient awake    Reviewed: Allergy & Precautions, H&P , NPO status , Patient's Chart, lab work & pertinent test results  Airway Mallampati: II  TM Distance: >3 FB Neck ROM: full    Dental no notable dental hx.    Pulmonary neg pulmonary ROS, former smoker   Pulmonary exam normal        Cardiovascular Exercise Tolerance: Good negative cardio ROS Normal cardiovascular exam     Neuro/Psych    GI/Hepatic negative GI ROS,,,  Endo/Other    Renal/GU   negative genitourinary   Musculoskeletal   Abdominal   Peds  Hematology negative hematology ROS (+)   Anesthesia Other Findings History of cholestasis during pregnancy  Past Medical History: No date: Allergy 10/09/2022: Allergy-induced asthma 04/21/2020: Encounter for supervision of normal pregnancy     Comment:  Formatting of this note is different from the original.               23 y.o. G1P0 at [redacted]w[redacted]d Patient's last menstrual period was              02/09/2020 (approximate). inconsistent with ultrasound @               [redacted]w[redacted]d. Estimated Date of Delivery: 12/10/20  Sex of baby               and name:  " "   FOB:       Factors complicating this               pregnancy   1. IOL scheduled for 11/20/20 at 0001 for               Cholestasis   JO  2. Large simple ovarian cyst   5.1cmon              3/30  2012 and 2013: Fracture of ankle, left, closed No date: Headache(784.0) 01/24/2012: Mononucleosis No date: Ovarian cyst 10/06/2022: Ovarian cyst, left     Comment:  Noted with first pregnancy, measuring 3.94 x 3.26 x 3.64              cm  at dating [place] with G2   No date: Seasonal allergies  Past Surgical History: No date: APPENDECTOMY 10/07/2013: LAPAROSCOPIC APPENDECTOMY; N/A     Comment:  Procedure: APPENDECTOMY LAPAROSCOPIC;  Surgeon: Dalia Heading, MD;  Location: [facility];   Service: General;                Laterality: N/A; No date: TONSILLECTOMY 01/30/2012: TONSILLECTOMY No date: WISDOM TOOTH EXTRACTION     Comment:  three; age 23  BMI    Body Mass Index: 29.23 kg/m      Reproductive/Obstetrics (+) Pregnancy                              Anesthesia Physical Anesthesia Plan  ASA: 2  Anesthesia Plan: Epidural   Post-op Pain Management:    Induction:   PONV Risk Score and Plan:   Airway Management Planned:   Additional Equipment:   Intra-op Plan:   Post-operative Plan:   Informed Consent: I have reviewed the patients History and Physical, chart, labs and discussed the procedure including the risks, benefits and alternatives for the proposed anesthesia with the patient or authorized  representative who has indicated his/her understanding and acceptance.       Plan Discussed with: Anesthesiologist and CRNA  Anesthesia Plan Comments:          Anesthesia Quick Evaluation

## 2023-04-07 NOTE — Progress Notes (Signed)
 L&D Note    Subjective:  Feeling pain with contractions  Objective:   Vitals:   04/07/23 0605 04/07/23 0703 04/07/23 0815 04/07/23 1051  BP: 98/61 107/64 103/60 110/71  Pulse: 75 76 65 (!) 109  Resp: 18 16    Temp: 98.2 F (36.8 C) 97.7 F (36.5 C)    TempSrc: Oral Oral    Weight:      Height:        Current Vital Signs 24h Vital Sign Ranges  T 97.7 F (36.5 C) Temp  Avg: 97.9 F (36.6 C)  Min: 97.7 F (36.5 C)  Max: 98.2 F (36.8 C)  BP 110/71 BP  Min: 98/61  Max: 110/71  HR (!) 109 Pulse  Avg: 82.6  Min: 65  Max: 109  RR 16 Resp  Avg: 16.7  Min: 16  Max: 18  SaO2     No data recorded      Gen: alert, cooperative, no distress FHR: Baseline: 140 bpm, Variability: moderate, Accels: Present, Decels: none Toco: none SVE: Dilation: 1.5 Effacement (%): 50 Cervical Position: Posterior Station: -1, -2 Presentation: Vertex Exam by:: Rubye Oaks, CNM  Medications SCHEDULED MEDICATIONS   ammonia       misoprostol       misoprostol  25 mcg Oral Q4H   And   misoprostol  25 mcg Vaginal Q4H   oxytocin       oxytocin 40 units in LR 1000 mL  333 mL Intravenous Once    MEDICATION INFUSIONS   lactated ringers     lactated ringers 125 mL/hr at 04/07/23 0729   oxytocin     oxytocin 6 milli-units/min (04/07/23 0945)   pencillin G potassium IV 3 Million Units (04/07/23 0710)    PRN MEDICATIONS  acetaminophen, ammonia, fentaNYL (SUBLIMAZE) injection, lactated ringers, lidocaine (PF), misoprostol, ondansetron, oxyCODONE-acetaminophen, oxyCODONE-acetaminophen, oxytocin, terbutaline   Assessment & Plan:  23 y.o. G2P1001 at [redacted]w[redacted]d admitted for Labor_induction_indication: symptoms of cholestasis in pregnancy with a hx of cholestasis in prior pregnancy -GBS: positive -IP Antibiotics: abx: Penicillin x3 doses -Membranes ruptured, clear fluid -Recheck:Evaluated by digital exam. -Preeclampsia:  labs stable -Pain: level of pain (1-10, 10 severe), 4-5/10 -Continue present management.  and Intervention: IV Pitocin augmentation, change maternal position, and anticipate vaginal delivery -Pe_uterus_labor: Pitocin at 6 mu/min., Contraction frequency is regular, every 2-3 minutes., and Adequate relaxation between contractions. -Analgesia: unmedicated labor support options  and declined medical intervention at this time   Chari Manning, CNM  04/07/2023 11:11 AM  [facility]

## 2023-04-08 ENCOUNTER — Encounter: Payer: Self-pay | Admitting: Obstetrics and Gynecology

## 2023-04-08 LAB — CBC
HCT: 30.4 % — ABNORMAL LOW (ref 36.0–46.0)
Hemoglobin: 10.4 g/dL — ABNORMAL LOW (ref 12.0–15.0)
MCH: 30.1 pg (ref 26.0–34.0)
MCHC: 34.2 g/dL (ref 30.0–36.0)
MCV: 88.1 fL (ref 80.0–100.0)
Platelets: 312 10*3/uL (ref 150–400)
RBC: 3.45 MIL/uL — ABNORMAL LOW (ref 3.87–5.11)
RDW: 12.5 % (ref 11.5–15.5)
WBC: 12.2 10*3/uL — ABNORMAL HIGH (ref 4.0–10.5)
nRBC: 0 % (ref 0.0–0.2)

## 2023-04-08 LAB — BILE ACIDS, TOTAL: Bile Acids Total: 2.3 umol/L (ref 0.0–10.0)

## 2023-04-08 NOTE — Progress Notes (Signed)
 Postpartum Day  1  Subjective: 23 y.o. [id number] postpartum day #1  status post normal spontaneous vaginal delivery. She is ambulating, is tolerating po, is voiding spontaneously.  Her pain is well controlled on PO pain medications. Her lochia is less than menses.  Objective: BP 100/63 (BP Location: Right Arm)   Pulse 89   Temp 98.8 F (37.1 C)   Resp 16   Ht 5\' 3"  (1.6 m)   Wt 74.8 kg   LMP  (LMP Unknown)   SpO2 98%   Breastfeeding Unknown   BMI 29.23 kg/m    Physical Exam:  General: alert, cooperative, and appears stated age Breasts: soft/nontender Pulm: nl effort Abdomen: soft, non-tender, active bowel sounds Uterine Fundus: firm Perineum: minimal edema, intact Lochia: appropriate DVT Evaluation: No evidence of DVT seen on physical exam. Negative Homan's sign. No cords or calf tenderness. No significant calf/ankle edema.  Recent Labs    04/07/23 0130 04/08/23 0547  HGB 11.1* 10.4*  HCT 32.7* 30.4*  WBC 12.4* 12.2*  PLT 344 312    Assessment/Plan: 22 y.o. [id number] Postpartum Day  1  1. Continue routine postpartum care  2. Infant feeding status: formula feeding  --Encouraged snug fitting bra, cold application, Tylenol PRN, and cabbage leaves for engorgement for formula feeding   3. Contraception plan: IUD  4. Acute blood loss anemia - clinically significant.  --Hemodynamically stable and asymptomatic --Intervention: no intervention   5. Immunization status: declined TDaP, Flu and RSV   Disposition: continue inpatient postpartum care , plan for discharge home tomorrow    LOS: 1 day   Kharee Lesesne, CNM 04/08/2023, 12:20 PM   ----- Chari Manning Certified Nurse Midwife [facility]

## 2023-04-08 NOTE — Anesthesia Postprocedure Evaluation (Signed)
 Anesthesia Post Note  Patient: Destiny Mckenzie  Procedure(s) Performed: AN AD HOC LABOR EPIDURAL  Patient location during evaluation: Mother Baby Anesthesia Type: Epidural Level of consciousness: awake and alert Pain management: pain level controlled Vital Signs Assessment: post-procedure vital signs reviewed and stable Respiratory status: spontaneous breathing, nonlabored ventilation and respiratory function stable Cardiovascular status: stable Postop Assessment: no headache, no backache and epidural receding Anesthetic complications: no   No notable events documented.   Last Vitals:  Vitals:   04/08/23 0505 04/08/23 0737  BP: 116/76 105/73  Pulse: 71 71  Resp: 18   Temp: 36.7 C 36.6 C  SpO2: 99% 100%    Last Pain:  Vitals:   04/08/23 0740  TempSrc:   PainSc: 1                  Foye Deer

## 2023-04-09 ENCOUNTER — Encounter: Payer: Self-pay | Admitting: Obstetrics and Gynecology

## 2023-04-09 NOTE — Progress Notes (Signed)
Discharge instructions reviewed with patient and her significant other.  Questions answered and follow up care reviewed.  Printed copies given to patient for reference after discharge home.
